# Patient Record
Sex: Male | Born: 1990 | Race: White | Hispanic: Yes | Marital: Single | Smoking: Never smoker
Health system: Southern US, Community
[De-identification: ages and names within clinical notes are randomized; demographics above are authoritative.]

## PROBLEM LIST (undated history)

## (undated) DIAGNOSIS — E119 Type 2 diabetes mellitus without complications: Secondary | ICD-10-CM

## (undated) DIAGNOSIS — T7840XA Allergy, unspecified, initial encounter: Secondary | ICD-10-CM

## (undated) HISTORY — DX: Allergy, unspecified, initial encounter: T78.40XA

## (undated) HISTORY — DX: Type 2 diabetes mellitus without complications: E11.9

---

## 2014-04-13 ENCOUNTER — Ambulatory Visit (INDEPENDENT_AMBULATORY_CARE_PROVIDER_SITE_OTHER): Payer: BC Managed Care – HMO

## 2014-04-13 ENCOUNTER — Ambulatory Visit (INDEPENDENT_AMBULATORY_CARE_PROVIDER_SITE_OTHER): Payer: BC Managed Care – HMO | Admitting: Family Medicine

## 2014-04-13 VITALS — BP 112/74 | HR 83 | Temp 97.8°F | Resp 16 | Ht 70.0 in | Wt 175.0 lb

## 2014-04-13 DIAGNOSIS — M79672 Pain in left foot: Secondary | ICD-10-CM

## 2014-04-13 DIAGNOSIS — M25572 Pain in left ankle and joints of left foot: Secondary | ICD-10-CM

## 2014-04-13 DIAGNOSIS — M7750 Other enthesopathy of unspecified foot: Secondary | ICD-10-CM

## 2014-04-13 DIAGNOSIS — M25579 Pain in unspecified ankle and joints of unspecified foot: Secondary | ICD-10-CM

## 2014-04-13 DIAGNOSIS — M79609 Pain in unspecified limb: Secondary | ICD-10-CM

## 2014-04-13 DIAGNOSIS — E109 Type 1 diabetes mellitus without complications: Secondary | ICD-10-CM

## 2014-04-13 DIAGNOSIS — M7672 Peroneal tendinitis, left leg: Secondary | ICD-10-CM

## 2014-04-13 MED ORDER — DICLOFENAC SODIUM 75 MG PO TBEC: 75.0000 mg | DELAYED_RELEASE_TABLET | Freq: Two times a day (BID) | ORAL | Status: DC

## 2014-04-13 NOTE — Progress Notes (Signed)
   Subjective:    Patient ID: Brett LandauRyan Hark, male    DOB: 1991-06-10, 23 y.o.   MRN: [id number]  HPI  Pt presents with 1 week h/o left lateral ankle pain - started when he was playing basketball it started to feel sore so he stopped playing basketball.  The next 2 days it was fine and then 4 days ago the pain started to return and has gotten worse since then.  He did not have a specific injury that he can think of to his ankle to cause his pain.  He has taken a few doses of Aleve without much help and he is not sure the ice he has used as helped his pain.  He has sprained his ankle in the past.  He has noticed some swelling but it has gotten a little better he thinks.  He is having trouble walking because of the pain.  Sugars are ok controlled and he has no kidney damage.   Review of Systems  Musculoskeletal: Positive for gait problem and joint swelling.       Objective:   Physical Exam  Vitals reviewed. Constitutional: He is oriented to person, place, and time. He appears well-developed and well-nourished.  Musculoskeletal:       Left ankle: He exhibits swelling (lateral ankle over peroneal tendon). He exhibits normal range of motion. No lateral malleolus and no medial malleolus tenderness found.       Left foot: He exhibits tenderness, bony tenderness and swelling.       Feet:  Pain with plantar flexion that is increased with resistance.  Pain with inversion and no pain with eversion.  Pulses intact. Sensation intact.  Pt has an antalgic gait.  Neurological: He is alert and oriented to person, place, and time.  Skin: Skin is warm and dry.  Psychiatric: He has a normal mood and affect. His behavior is normal. Judgment and thought content normal.   [facility] reading (PRIMARY) by  Dr. Katrinka BlazingSmith.  neg      Assessment & Plan:  Foot pain, left - Plan: DG Foot Complete Left  Ankle pain, left - Plan: DG Ankle Complete Left  Peroneal tendonitis of left lower extremity - Plan: diclofenac (VOLTAREN) 75  MG EC tablet  Pt has an ASO brace at home that he wants to wear, he does not want a camwalker due to his job at [employer].  He did not want crutches.  He will use the Rx NSAIDs and we talked about ice massage and rest.  If he has no improvement in 7-10 days he will RTC for a recheck.  Benny LennertSarah Weber PA-C  [facility] 04/13/2014 7:30 PM

## 2014-04-13 NOTE — Progress Notes (Signed)
History and physical examinations reviewed in detail with Benny LennertSarah Weber, PA-C.  Xrays reviewed.  Agree with A/P.

## 2014-04-13 NOTE — Patient Instructions (Signed)
Crutches until the pain is improved. Medications for inflammation. Recheck in 7-10d if no better.  Peroneal tendonitis

## 2015-04-20 ENCOUNTER — Ambulatory Visit (INDEPENDENT_AMBULATORY_CARE_PROVIDER_SITE_OTHER): Payer: Commercial Managed Care - PPO | Admitting: Internal Medicine

## 2015-04-20 VITALS — BP 108/74 | HR 88 | Temp 98.2°F | Resp 18 | Ht 69.0 in | Wt 187.0 lb

## 2015-04-20 DIAGNOSIS — H6123 Impacted cerumen, bilateral: Secondary | ICD-10-CM

## 2015-04-20 NOTE — Progress Notes (Signed)
   Subjective:  This chart was scribed for Brett Sia, MD by Brett Lee, Medical Scribe. This patient was seen in Room 1 and the patient's care was started at 5:45 PM.     Patient ID: Brett Lee, male    DOB: [birth year], 24 y.o.   MRN: [id number]  HPI Brett Lee is a 24 y.o. male who presents to [facility] complaining of some gradual onset ear fullness after swimming in [place] and [place] last week. He's never had problems with this before. He has not done anything to relief this.  His hearing seems to be muffled.  Patient Active Problem List   Diagnosis Date Noted  . Diabetes mellitus type 1 04/13/2014    Current outpatient prescriptions:  .  insulin aspart (NOVOLOG) 100 unit/mL injection, Inject 10 Units into the skin 3 (three) times daily before meals., Disp: , Rfl:      Review of Systems  Constitutional: Negative for fever, chills, diaphoresis and fatigue.  HENT: Positive for hearing loss. Negative for rhinorrhea and sneezing.   Respiratory: Negative for cough and shortness of breath.   Cardiovascular: Negative for chest pain.  Gastrointestinal: Negative for nausea, vomiting, diarrhea and constipation.  Skin: Negative for rash and wound.  Neurological: Negative for headaches.       Objective:   Physical Exam  Constitutional: He is oriented to person, place, and time. He appears well-developed and well-nourished. No distress.  HENT:  Head: Normocephalic and atraumatic.  Both ear canals are occluded by cerumen  Eyes: Conjunctivae and EOM are normal. Pupils are equal, round, and reactive to light.  Neck: Neck supple.  Cardiovascular: Normal rate.   Pulmonary/Chest: Effort normal. No respiratory distress.  Musculoskeletal: Normal range of motion.  Lymphadenopathy:    He has no cervical adenopathy.  Neurological: He is alert and oriented to person, place, and time.  Skin: Skin is warm and dry.  Psychiatric: He has a normal mood and affect. His behavior is normal.    Nursing note and vitals reviewed. BP 108/74 mmHg  Pulse 88  Temp(Src) 98.2 F (36.8 C)  Resp 18  Ht 5\' 9"  (1.753 m)  Wt 187 lb (84.823 kg)  BMI 27.60 kg/m2  SpO2 98%  Procedure= irrigation bilaterally with successful canals are clear post irrigation without signs of external otitis. Tms are intact.       Assessment & Plan:  Cerumen impaction  Mineral oil if any itching otherwise follow-up when necessary I have completed the patient encounter in its entirety as documented by the scribe, with editing by me where necessary. Tamaya Pun P. Merla Riches, M.D.

## 2015-06-23 ENCOUNTER — Ambulatory Visit (INDEPENDENT_AMBULATORY_CARE_PROVIDER_SITE_OTHER): Payer: Commercial Managed Care - PPO | Admitting: Family Medicine

## 2015-06-23 VITALS — BP 130/80 | HR 89 | Temp 98.3°F | Resp 16 | Ht 69.5 in | Wt 179.0 lb

## 2015-06-23 DIAGNOSIS — J039 Acute tonsillitis, unspecified: Secondary | ICD-10-CM

## 2015-06-23 MED ORDER — CEFTRIAXONE SODIUM 1 G IJ SOLR
1.0000 g | Freq: Once | INTRAMUSCULAR | Status: AC
  Administered 2015-06-23: 1 g via INTRAMUSCULAR

## 2015-06-23 MED ORDER — AMOXICILLIN-POT CLAVULANATE 875-125 MG PO TABS: 1.0000 | ORAL_TABLET | Freq: Two times a day (BID) | ORAL | Status: DC

## 2015-06-23 MED ORDER — MAGIC MOUTHWASH W/LIDOCAINE: 10.0000 mL | ORAL | Status: DC | PRN

## 2015-06-23 NOTE — Progress Notes (Signed)
Subjective:  This chart was scribed for Elvina Sidle MD, by Veverly Fells, at [facility]  This patient was seen in room  13  and the patient's care was started at 10:13 AM.    Patient ID: Brett Lee, male    DOB: [birth year], 24 y.o.   MRN: [id number] Chief Complaint  Patient presents with  . Sore Throat    today  . Dizziness    HPI HPI Comments: Brett Lee is a 24 y.o. male who [facility] complaining of a sore throat and dizziness/lighteadedness onset this morning.  Patient denies having sore throats frequently. He has a history of diabetes and states that his colds usually last for a long time.  He is requesting a note for school and does not think that he is able to go today.  He is physically fit and played football in highschool.    He is currently attending [employer] and is a communications major.  He has no other complaints or concerns today.     Patient Active Problem List   Diagnosis Date Noted  . Diabetes mellitus type 1 04/13/2014   Past Medical History  Diagnosis Date  . Allergy   . Diabetes mellitus without complication    History reviewed. No pertinent past surgical history. No Known Allergies Prior to Admission medications   Medication Sig Start Date End Date Taking? Authorizing Provider  insulin aspart (NOVOLOG) 100 unit/mL injection Inject 10 Units into the skin 3 (three) times daily before meals.   Yes Historical Provider, MD   Social History   Social History  . Marital Status: Single    Spouse Name: N/A  . Number of Children: N/A  . Years of Education: N/A   Occupational History  . Not on file.   Social History Main Topics  . Smoking status: Current Some Day Smoker  . Smokeless tobacco: Not on file  . Alcohol Use: Not on file  . Drug Use: Not on file  . Sexual Activity: Not on file   Other Topics Concern  . Not on file   Social History Narrative       Review of Systems    HENT: Positive for sore throat.   Neurological: Positive for dizziness.       Objective:   Physical Exam  Constitutional: He is oriented to person, place, and time. He appears well-developed and well-nourished. No distress.  HENT:  Head: Normocephalic and atraumatic.  Eyes: Conjunctivae and EOM are normal. Pupils are equal, round, and reactive to light.  Cardiovascular: Normal rate.   Pulmonary/Chest: Effort normal. No respiratory distress.  Musculoskeletal: Normal range of motion.  Neurological: He is alert and oriented to person, place, and time.  Skin: Skin is warm and dry.  Psychiatric: He has a normal mood and affect. His behavior is normal.  Nursing note and vitals reviewed.  CONSTITUTIONAL: Well developed/well nourished HEAD: Normocephalic/atraumatic EYES: EOMI/PERRL ENMT: Mucous membranes moist, marked erythema and swelling left tonsillar area with early white exudate NECK: supple no meningeal signs SPINE/BACK:entire spine nontender CV: S1/S2 noted, no murmurs/rubs/gallops noted LUNGS: Lungs are clear to auscultation bilaterally, no apparent distress ABDOMEN: soft, nontender, no rebound or guarding, bowel sounds noted throughout abdomen GU:no cva tenderness NEURO: Pt is awake/alert/appropriate, moves all extremitiesx4.  No facial droop.   EXTREMITIES: pulses normal/equal, full ROM SKIN: warm, color normal PSYCH: no abnormalities of mood noted, alert and oriented to situation     Assessment &  Plan:   This chart was scribed in my presence and reviewed by me personally.    ICD-9-CM ICD-10-CM   1. Acute tonsillitis 463 J03.90 cefTRIAXone (ROCEPHIN) injection 1 g     amoxicillin-clavulanate (AUGMENTIN) 875-125 MG per tablet     magic mouthwash w/lidocaine SOLN     Signed, Elvina Sidle, MD

## 2015-06-27 ENCOUNTER — Ambulatory Visit (INDEPENDENT_AMBULATORY_CARE_PROVIDER_SITE_OTHER): Payer: Commercial Managed Care - PPO | Admitting: Emergency Medicine

## 2015-06-27 VITALS — BP 118/68 | HR 74 | Temp 98.2°F | Resp 16 | Ht 69.0 in | Wt 181.6 lb

## 2015-06-27 DIAGNOSIS — Z202 Contact with and (suspected) exposure to infections with a predominantly sexual mode of transmission: Secondary | ICD-10-CM

## 2015-06-27 LAB — HIV ANTIBODY (ROUTINE TESTING W REFLEX): HIV: NONREACTIVE

## 2015-06-27 MED ORDER — AZITHROMYCIN 250 MG PO TABS: ORAL_TABLET | ORAL | Status: DC

## 2015-06-27 NOTE — Progress Notes (Signed)
Patient ID: Brett Lee, male   DOB: [birth year], 24 y.o.   MRN: [id number]    This chart was scribed for Collene Gobble, MD by Charline Bills, ED Scribe. The patient was seen in room 1. Patient's care was started at 1:55 PM.  Chief Complaint:  Chief Complaint  Patient presents with  . Exposure to STD    possible std , x 1 day    HPI: Brett Lee is a 24 y.o. male, with a h/o DM, who reports to [facility] today for STD exposure. Pt states that his male partner contacted him yesterday after testing positive for chlamydia. He reports that neither himself nor his partner have any symptoms. He also reports that he did not consistently wear condoms a while ago, but now he always uses condoms. Pt reports 30 partners in the last 2 years. No ho previous STD.   Past Medical History  Diagnosis Date  . Allergy   . Diabetes mellitus without complication    History reviewed. No pertinent past surgical history. Social History   Social History  . Marital Status: Single    Spouse Name: N/A  . Number of Children: N/A  . Years of Education: N/A   Social History Main Topics  . Smoking status: Never Smoker   . Smokeless tobacco: None  . Alcohol Use: 1.2 oz/week    2 Standard drinks or equivalent per week  . Drug Use: None  . Sexual Activity: Not Asked   Other Topics Concern  . None   Social History Narrative   History reviewed. No pertinent family history. No Known Allergies Prior to Admission medications   Medication Sig Start Date End Date Taking? Authorizing Provider  insulin aspart (NOVOLOG) 100 unit/mL injection Inject 10 Units into the skin 3 (three) times daily before meals.   Yes Historical Provider, MD  magic mouthwash w/lidocaine SOLN Take 10 mLs by mouth every 2 (two) hours as needed for mouth pain. 06/23/15  Yes Elvina Sidle, MD     ROS: The patient denies fevers, chills, night sweats, unintentional weight loss, chest pain, palpitations, wheezing, dyspnea on exertion, nausea,  vomiting, abdominal pain, dysuria, hematuria, melena, numbness, weakness, or tingling.  All other systems have been reviewed and were otherwise negative with the exception of those mentioned in the HPI and as above.    PHYSICAL EXAM: Filed Vitals:   06/27/15 1337  BP: 118/68  Pulse: 74  Temp: 98.2 F (36.8 C)  Resp: 16   Body mass index is 26.81 kg/(m^2).   General: Alert, no acute distress HEENT:  Normocephalic, atraumatic, oropharynx patent. Eye: Nonie Hoyer [facility] Cardiovascular:  Regular rate and rhythm, no rubs murmurs or gallops. No Carotid bruits, radial pulse intact. No pedal edema.  Respiratory: Clear to auscultation bilaterally. No wheezes, rales, or rhonchi. No cyanosis, no use of accessory musculature Abdominal: No organomegaly, abdomen is soft and non-tender, positive bowel sounds. No masses. Small molluscum to lower abdomen.  Musculoskeletal: Gait intact. No edema, tenderness GU: Minimal redness around the meatus but no discharge.  Skin: No rashes. Neurologic: Facial musculature symmetric. Psychiatric: Patient acts appropriately throughout our interaction. Lymphatic: No cervical or submandibular lymphadenopathy Genitourinary/Anorectal: No acute findings   LABS: No results found for this or any previous visit.   EKG/XRAY:   Primary read interpreted by Dr. Cleta Alberts at [facility].   ASSESSMENT/PLAN: STD testing was done. He was given a prescription for Zithromax 1 g. Will call when results are back. I personally performed the services described in this  documentation, which was scribed in my presence. The recorded information has been reviewed and is accurate.  Gross sideeffects, risk and benefits, and alternatives of medications d/w patient. Patient is aware that all medications have potential sideeffects and we are unable to predict every sideeffect or drug-drug interaction that may occur.  Lesle Chris MD 06/27/2015 1:55 PM

## 2015-06-27 NOTE — Patient Instructions (Signed)
Chlamydia Chlamydia is an infection. It is spread through sexual contact. Chlamydia can be in different areas of the body. These areas include the urethra, throat, or rectum. It is important to treat chlamydia as soon as possible. It can damage other organs.  CAUSES  Chlamydia is caused by bacteria. It is a sexually transmitted disease. This means that it is passed from an infected partner during intimate contact. This contact could be with the genitals, mouth, or rectal area.  SIGNS AND SYMPTOMS  There may not be any symptoms. This is often the case early in the infection. If there are symptoms, they are usually mild and may only be noticeable in the morning. Symptoms you may notice include:   Burning with urination.  Pain or swelling in the testicles.  Watery mucus-like discharge from the penis.  Long-standing (chronic) pelvic pain after frequent infections.  Pain, swelling, or itching around the anus.  A sore throat.  Itching, burning, or redness in the eyes, or discharge from the eyes. DIAGNOSIS  To diagnose this infection, your health care provider will do a pelvic exam. A sample of urine or a swab from the rectum may be taken for testing.  TREATMENT  Chlamydia is treated with antibiotic medicines.  HOME CARE INSTRUCTIONS  Take your antibiotic medicine as directed by your health care provider. Finish the antibiotic even if you start to feel better. Incomplete treatment will put you at risk for not being able to have children (sterility).   Take medicines only as directed by your health care provider.   Rest.   Inform any sexual partners about your infection. Even if they are symptom free or have a negative culture or evaluation, they should be treated for the condition.   Do not have sex (intercourse) until treatment is completed and your health care provider says it is okay.   Keep all follow-up visits as directed by your health care provider.   Not all test results  are available during your visit. If your test results are not back during the visit, make an appointment with your health care provider to find out the results. Do not assume everything is normal if you have not heard from your health care provider or the medical facility. It is your responsibility to get your test results. SEEK MEDICAL CARE IF:  You develop new joint pain.  You have a fever. SEEK IMMEDIATE MEDICAL CARE IF:   Your pain increases.   You have abnormal discharge.   You have pain during intercourse. MAKE SURE YOU:   Understand these instructions.  Will watch your condition.  Will get help right away if you are not doing well or get worse. Document Released: 10/10/2005 Document Revised: 02/24/2014 Document Reviewed: 04/18/2013 ExitCare Patient Information 2015 [employer]. This information is not intended to replace advice given to you by your health care provider. Make sure you discuss any questions you have with your health care provider.  

## 2015-06-28 LAB — HEPATITIS C ANTIBODY: HCV AB: NEGATIVE

## 2015-06-28 LAB — RPR

## 2015-06-30 LAB — HSV(HERPES SIMPLEX VRS) I + II AB-IGG
HSV 1 Glycoprotein G Ab, IgG: 1.11 IV — ABNORMAL HIGH
HSV 2 Glycoprotein G Ab, IgG: 0.35 IV

## 2015-07-01 LAB — GC/CHLAMYDIA PROBE AMP
CT Probe RNA: POSITIVE — AB
GC PROBE AMP APTIMA: NEGATIVE

## 2015-12-30 ENCOUNTER — Ambulatory Visit (INDEPENDENT_AMBULATORY_CARE_PROVIDER_SITE_OTHER): Payer: Commercial Managed Care - PPO | Admitting: Physician Assistant

## 2015-12-30 VITALS — BP 130/90 | HR 87 | Temp 98.3°F | Resp 16 | Ht 69.25 in | Wt 180.0 lb

## 2015-12-30 DIAGNOSIS — R238 Other skin changes: Secondary | ICD-10-CM

## 2015-12-30 NOTE — Progress Notes (Signed)
[facility] [address], [place] [address] [phone]  Date:  12/30/2015   Name:  Nadeen LandauRyan Ishaq   DOB:  07-07-1991   MRN:  [id number]  PCP:  No PCP Per Patient    Chief Complaint  Patient presents with  . Mass    on penis x 1 week    History of Present Illness:  Nadeen LandauRyan Tabora is a 25 y.o. male patient [facility] chief complaint of bump on penis.     Bump without pain or pruritus.  There have been no changes for 1 week.  There is no drainage.    Sexually active protected "85%" of time.  No abnormal hematuria, dysuria, or frequency.    Patient Active Problem List   Diagnosis Date Noted  . Diabetes mellitus type 1 (HCC) 04/13/2014    Past Medical History  Diagnosis Date  . Allergy   . Diabetes mellitus without complication (HCC)     No past surgical history on file.  Social History  Substance Use Topics  . Smoking status: Never Smoker   . Smokeless tobacco: None  . Alcohol Use: 1.2 oz/week    2 Standard drinks or equivalent per week    No family history on file.  Allergies  Allergen Reactions  . Other Other (See Comments)    " I have seasonal allergies, I think pollen and I get a stuffy nose and start sneezing."  Per patient.    Medication list has been reviewed and updated.  Current Outpatient Prescriptions on File Prior to Visit  Medication Sig Dispense Refill  . insulin aspart (NOVOLOG) 100 unit/mL injection Inject 10 Units into the skin 3 (three) times daily before meals.     No current facility-administered medications on file prior to visit.    ROS ROS otherwise unremarkable unless listed above.   Physical Examination: BP 130/90 mmHg  Pulse 87  Temp(Src) 98.3 F (36.8 C) (Oral)  Resp 16  Ht 5' 9.25" (1.759 m)  Wt 180 lb (81.647 kg)  BMI 26.39 kg/m2  SpO2 98% Ideal Body Weight: Weight in (lb) to have BMI = 25: 170.2  Physical Exam  Constitutional: He is oriented to person, place, and time. He appears  well-developed and well-nourished. No distress.  HENT:  Head: Normocephalic and atraumatic.  Eyes: Conjunctivae and EOM are normal. Pupils are equal, round, and reactive to light.  Cardiovascular: Normal rate.   Pulmonary/Chest: Effort normal. No respiratory distress.  Genitourinary: Penis normal. Circumcised.  Small hypopigmented papule at the inferior shaft.  Non-tender without erythema or friability.  Neurological: He is alert and oriented to person, place, and time.  Skin: Skin is warm and dry. He is not diaphoretic.  Psychiatric: He has a normal mood and affect. His behavior is normal.     Assessment and Plan: Nadeen LandauRyan Dowty is a 25 y.o. male who is here today for cc of bump on penis. This appears benign, and possibly follicular bump (acne).  This could also be a scant normal anatomy feature that he recently stumbled upon (penile papule/hirsutoid papilloma). I have advised him to monitor the bump.  If there are any changes, he will return.  He declines std testing.  Papule of skin  Trena PlattStephanie English, PA-C [facility] 12/30/2015 2:43 PM

## 2015-12-30 NOTE — Patient Instructions (Signed)
Please return if you notice changes to this lesion.  This can be a pimple or clogged pore.  You can try using a warm compress.  But there is nothing necessary to do if there is no change to that bump.

## 2016-05-17 ENCOUNTER — Emergency Department (HOSPITAL_COMMUNITY)
Admission: EM | Admit: 2016-05-17 | Discharge: 2016-05-17 | Disposition: A | Discharge status: Home / Self Care | Payer: Commercial Managed Care - PPO | Attending: Emergency Medicine | Admitting: Emergency Medicine

## 2016-05-17 ENCOUNTER — Encounter (HOSPITAL_COMMUNITY): Payer: Self-pay | Admitting: Adult Health

## 2016-05-17 DIAGNOSIS — A64 Unspecified sexually transmitted disease: Secondary | ICD-10-CM | POA: Diagnosis present

## 2016-05-17 DIAGNOSIS — E109 Type 1 diabetes mellitus without complications: Secondary | ICD-10-CM | POA: Diagnosis not present

## 2016-05-17 DIAGNOSIS — R369 Urethral discharge, unspecified: Secondary | ICD-10-CM | POA: Insufficient documentation

## 2016-05-17 DIAGNOSIS — Z711 Person with feared health complaint in whom no diagnosis is made: Secondary | ICD-10-CM

## 2016-05-17 LAB — URINALYSIS, ROUTINE W REFLEX MICROSCOPIC
Bilirubin Urine: NEGATIVE
Glucose, UA: NEGATIVE mg/dL
Hgb urine dipstick: NEGATIVE
Ketones, ur: NEGATIVE mg/dL
NITRITE: NEGATIVE
Protein, ur: NEGATIVE mg/dL
SPECIFIC GRAVITY, URINE: 1.027 (ref 1.005–1.030)
pH: 7 (ref 5.0–8.0)

## 2016-05-17 LAB — URINE MICROSCOPIC-ADD ON

## 2016-05-17 LAB — HIV ANTIBODY (ROUTINE TESTING W REFLEX): HIV SCREEN 4TH GENERATION: NONREACTIVE

## 2016-05-17 LAB — RPR: RPR: NONREACTIVE

## 2016-05-17 MED ORDER — LIDOCAINE HCL (PF) 1 % IJ SOLN
5.0000 mL | Freq: Once | INTRAMUSCULAR | Status: AC
  Administered 2016-05-17: 5 mL via INTRADERMAL
  Filled 2016-05-17: qty 5

## 2016-05-17 MED ORDER — AZITHROMYCIN 250 MG PO TABS
1000.0000 mg | ORAL_TABLET | Freq: Once | ORAL | Status: AC
  Administered 2016-05-17: 1000 mg via ORAL
  Filled 2016-05-17: qty 4

## 2016-05-17 MED ORDER — CEFTRIAXONE SODIUM 250 MG IJ SOLR
250.0000 mg | Freq: Once | INTRAMUSCULAR | Status: AC
  Administered 2016-05-17: 250 mg via INTRAMUSCULAR
  Filled 2016-05-17: qty 250

## 2016-05-17 NOTE — ED Triage Notes (Signed)
Presents with 4 days of penile discharge-states, "it feels like I have chlamydia and I have had it before" endorses pain at tip of penis.

## 2016-05-17 NOTE — Discharge Instructions (Signed)
1. Medications: usual home medications 2. Treatment: rest, drink plenty of fluids, use a condom with every sexual encounter 3. Follow Up: Please followup with your primary doctor in 3-7 days for discussion of your diagnoses and further evaluation after today's visit; if you do not have a primary care doctor use the resource guide provided to find one; Please return to the ER for worsening symptoms, high fevers or persistent vomiting.  You have been tested for HIV, syphilis, chlamydia and gonorrhea.  These results will be available in approximately 3 days.  Please inform all sexual partners if you test positive for any of these diseases.

## 2016-05-17 NOTE — ED Provider Notes (Signed)
[facility]-EMERGENCY DEPT Provider Note   CSN: [id number] Arrival date & time: 05/17/16  1740  First Provider Contact:  First MD Initiated Contact with Patient 05/17/16 0507        History   Chief Complaint Chief Complaint  Patient presents with  . SEXUALLY TRANSMITTED DISEASE    HPI Brett Lee is a 25 y.o. male.  Brett Lee is a 25 y.o. male  with a hx of NIDDM presents to the Emergency Department complaining of gradual, persistent, progressively worsening penile discharge onset 4 days ago. Patient reports he's had a minimum of 7 partners in the last 3 months. All male. He reports a history of Chlamydia in the past. He denies any open lesions to his penis. He denies testicular pain, abdominal pain, painful bowel movements, fever, chills, nausea, vomiting.  No aggravating or alleviating factors. No hematuria or overt dysuria.  No treatments prior to arrival.   The history is provided by the patient and medical records. No language interpreter was used.    Past Medical History:  Diagnosis Date  . Allergy   . Diabetes mellitus without complication [facility])     Patient Active Problem List   Diagnosis Date Noted  . Diabetes mellitus type 1 (HCC) 04/13/2014    History reviewed. No pertinent surgical history.     Home Medications    Prior to Admission medications   Medication Sig Start Date End Date Taking? Authorizing Provider  insulin aspart (NOVOLOG) 100 unit/mL injection Inject 10 Units into the skin 3 (three) times daily before meals.    Historical Provider, MD    Family History History reviewed. No pertinent family history.  Social History Social History  Substance Use Topics  . Smoking status: Never Smoker  . Smokeless tobacco: Never Used  . Alcohol use 1.2 oz/week    2 Standard drinks or equivalent per week     Allergies   Other   Review of Systems Review of Systems  Constitutional: Negative for chills and fever.  Gastrointestinal: Negative for  abdominal pain, nausea and vomiting.  Genitourinary: Positive for discharge. Negative for dysuria, penile swelling and scrotal swelling.  All other systems reviewed and are negative.    Physical Exam Updated Vital Signs BP 140/96   Pulse 92   Temp 98.5 F (36.9 C) (Oral)   Resp 20   Ht 5' 9.75" (1.772 m)   Wt 79.4 kg   SpO2 99%   BMI 25.29 kg/m   Physical Exam  Constitutional: He appears well-developed and well-nourished. No distress.  Awake, alert, nontoxic appearance  HENT:  Head: Normocephalic and atraumatic.  Mouth/Throat: Oropharynx is clear and moist. No oropharyngeal exudate.  Eyes: Conjunctivae are normal. No scleral icterus.  Neck: Normal range of motion. Neck supple.  Cardiovascular: Normal rate, regular rhythm and intact distal pulses.   Pulmonary/Chest: Effort normal and breath sounds normal. No respiratory distress. He has no wheezes.  Equal chest expansion  Abdominal: Soft. Bowel sounds are normal. He exhibits no mass. There is no tenderness. There is no rebound and no guarding. Hernia confirmed negative in the right inguinal area and confirmed negative in the left inguinal area.  Genitourinary: Testes normal. Right testis shows no mass, no swelling and no tenderness. Right testis is descended. Cremasteric reflex is not absent on the right side. Left testis shows no mass, no swelling and no tenderness. Left testis is descended. Cremasteric reflex is not absent on the left side. Circumcised. Penile erythema (mild erythema noted to the urethral opening)  present. No phimosis, paraphimosis, hypospadias or penile tenderness. Discharge (milky white) found.  Musculoskeletal: Normal range of motion. He exhibits no edema.  Lymphadenopathy: No inguinal adenopathy noted on the right or left side.  Neurological: He is alert.  Speech is clear and goal oriented Moves extremities without ataxia  Skin: Skin is warm and dry. He is not diaphoretic.  Psychiatric: He has a normal mood  and affect.  Nursing note and vitals reviewed.    ED Treatments / Results  Labs (all labs ordered are listed, but only abnormal results are displayed) Labs Reviewed  URINALYSIS, ROUTINE W REFLEX MICROSCOPIC (NOT AT [facility]) - Abnormal; Notable for the following:       Result Value   Leukocytes, UA MODERATE (*)    All other components within normal limits  URINE MICROSCOPIC-ADD ON - Abnormal; Notable for the following:    Squamous Epithelial / LPF 0-5 (*)    Bacteria, UA FEW (*)    All other components within normal limits  RPR  HIV ANTIBODY (ROUTINE TESTING)  GC/CHLAMYDIA PROBE AMP ([place]) NOT AT [facility]    EKG  EKG Interpretation None       Radiology No results found.  Procedures Procedures (including critical care time)  Medications Ordered in ED Medications  cefTRIAXone (ROCEPHIN) injection 250 mg (not administered)  lidocaine (PF) (XYLOCAINE) 1 % injection 5 mL (not administered)  azithromycin (ZITHROMAX) tablet 1,000 mg (not administered)     Initial Impression / Assessment and Plan / ED Course  I have reviewed the triage vital signs and the nursing notes.  Pertinent labs & imaging results that were available during my care of the patient were reviewed by me and considered in my medical decision making (see chart for details).  Clinical Course   Brett Lee presents with penile discharge.  Patient is afebrile without abdominal tenderness, abdominal pain or painful bowel movements to indicate prostatitis.  No tenderness to palpation of the testes or epididymis to suggest orchitis or epididymitis.  STD cultures obtained including HIV, syphilis, gonorrhea and chlamydia. Patient to be discharged with instructions to follow up with PCP. Discussed importance of using protection when sexually active. Pt understands that they have GC/Chlamydia cultures pending and that they will need to inform all sexual partners if results return positive. Patient has been treated  prophylactically with azithromycin and Rocephin.   Final Clinical Impressions(s) / ED Diagnoses   Final diagnoses:  Concern about STD in male without diagnosis  Penile discharge    New Prescriptions New Prescriptions   No medications on file     Dierdre Forth, PA-C 05/17/16 0526    Dione Booze, MD 05/17/16 [phone]

## 2016-05-17 NOTE — ED Notes (Signed)
Pt upset about wait time and asking if charges will still apply "if I leave, I haven't seen a doctor yet, so it shouldn't matter." Informed pt that provider has signed up to see him.

## 2016-11-17 ENCOUNTER — Ambulatory Visit (INDEPENDENT_AMBULATORY_CARE_PROVIDER_SITE_OTHER): Payer: Managed Care, Other (non HMO) | Admitting: Family Medicine

## 2016-11-17 VITALS — BP 130/80 | HR 86 | Temp 97.4°F | Resp 16 | Ht 69.0 in | Wt 186.0 lb

## 2016-11-17 DIAGNOSIS — Z23 Encounter for immunization: Secondary | ICD-10-CM

## 2016-11-17 DIAGNOSIS — Z202 Contact with and (suspected) exposure to infections with a predominantly sexual mode of transmission: Secondary | ICD-10-CM

## 2016-11-17 NOTE — Patient Instructions (Addendum)
  In the event of flulike symptoms at any time you should still seek medical help, because if treated within the first 48 hours of onset of symptoms antiviral medication can reduce the illness. The shot is not perfect, but this does reduce risk of getting the flu and if he should get the flu usually is a much milder illness. However in a diabetic you should be cautious.  Always advise practicing safe sex if you choose to be sexually involved.   IF you received an x-ray today, you will receive an invoice from [facility]. Please contact [facility] at [phone] with questions or concerns regarding your invoice.   IF you received labwork today, you will receive an invoice from [facility]. Please contact [facility] at [phone] with questions or concerns regarding your invoice.   Our billing staff will not be able to assist you with questions regarding bills from these companies.  You will be contacted with the lab results as soon as they are available. The fastest way to get your results is to activate your My Chart account. Instructions are located on the last page of this paperwork. If you have not heard from us regarding the results in 2 weeks, please contact this office.

## 2016-11-17 NOTE — Progress Notes (Signed)
Patient ID: Brett LandauRyan Lee, male    DOB: 09-04-1991  Age: 26 y.o. MRN: [id number]  Chief Complaint  Patient presents with  . STD check  . 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 He has been treated last summer for chlamydia. Since then he had a sexual encounter where he did not use protection, and could've been exposed to an STD. He would like to be retested.  He is due an influenza vaccine. He is a type I diabetic and is [place] in [place].  No other current or recent illnesses  Current allergies, medications, problem list, past/family and social histories reviewed.  Objective:  BP 130/80   Pulse 86   Temp 97.4 F (36.3 C) (Oral)   Resp 16   Ht 5\' 9"  (1.753 m)   Wt 186 lb (84.4 kg)   SpO2 98%   BMI 27.47 kg/m   Healthy-appearing young man. He is asymptomatic. Physical exam was not performed today.  Assessment & Plan:   Assessment: 1. Possible exposure to STD   2. Needs flu shot       Plan: Check STD testing including HIV, RPR, and a URiprobe Flu vaccine  Orders Placed This Encounter  Procedures  . GC/Chlamydia Probe Amp  . Flu Vaccine QUAD 36+ mos IM  . RPR  . HIV antibody    No orders of the defined types were placed in this encounter.        Patient Instructions    In the event of flulike symptoms at any time you should still seek medical help, because if treated within the first 48 hours of onset of symptoms antiviral medication can reduce the illness. The shot is not perfect, but this does reduce risk of getting the flu and if he should get the flu usually is a much milder illness. However in a diabetic you should be cautious.  Always advise practicing safe sex if you choose to be sexually involved.   IF you received an x-ray today, you will receive an invoice from [facility]. Please contact [facility] at [phone] with questions or concerns regarding your invoice.    IF you received labwork today, you will receive an invoice from [facility]. Please contact [facility] at [phone] with questions or concerns regarding your invoice.   Our billing staff will not be able to assist you with questions regarding bills from these companies.  You will be contacted with the lab results as soon as they are available. The fastest way to get your results is to activate your My Chart account. Instructions are located on the last page of this paperwork. If you have not heard from us regarding the results in 2 weeks, please contact this office.        No Follow-up on file.   HOPPER,DAVID, MD 11/17/2016

## 2016-11-18 LAB — HIV ANTIBODY (ROUTINE TESTING W REFLEX): HIV Screen 4th Generation wRfx: NONREACTIVE

## 2016-11-18 LAB — RPR: RPR: NONREACTIVE

## 2016-11-23 LAB — GC/CHLAMYDIA PROBE AMP
Chlamydia trachomatis, NAA: NEGATIVE
NEISSERIA GONORRHOEAE BY PCR: NEGATIVE

## 2017-01-27 ENCOUNTER — Ambulatory Visit (HOSPITAL_COMMUNITY)
Admission: EM | Admit: 2017-01-27 | Discharge: 2017-01-27 | Disposition: A | Discharge status: Home / Self Care | Payer: 59 | Attending: Internal Medicine | Admitting: Internal Medicine

## 2017-01-27 ENCOUNTER — Encounter (HOSPITAL_COMMUNITY): Payer: Self-pay | Admitting: *Deleted

## 2017-01-27 DIAGNOSIS — J301 Allergic rhinitis due to pollen: Secondary | ICD-10-CM

## 2017-01-27 DIAGNOSIS — B001 Herpesviral vesicular dermatitis: Secondary | ICD-10-CM | POA: Diagnosis not present

## 2017-01-27 MED ORDER — VALACYCLOVIR HCL 1 G PO TABS: ORAL_TABLET | ORAL | 0 refills | Status: AC

## 2017-01-27 NOTE — ED Triage Notes (Signed)
Mouth  Sores       X  2  Days  Ago     muitiple  Lesions          aleergie  Symptoms  As   Well  To  Include  Cough  Congested   And  Sinus   Drainage

## 2017-01-27 NOTE — Discharge Instructions (Signed)
Take the Valtrex as directed. Medications to help with symptoms of allergies are listed below. Sudafed PE 10 mg every 4 to 6 hours as needed for congestion Allegra or Zyrtec daily as needed for drainage and runny nose. For stronger antihistamine may take Chlor-Trimeton 2 to 4 mg every 4 to 6 hours, may cause drowsiness. Saline nasal spray used frequently. Ibuprofen 600 mg every 6 hours as needed for pain, discomfort or fever. Drink plenty of fluids and stay well-hydrated. Flonase or Rhinocort nasal spray daily

## 2017-01-27 NOTE — ED Provider Notes (Signed)
CSN: [id number]     Arrival date & time 01/27/17  1446 History   First MD Initiated Contact with Patient 01/27/17 1524     Chief Complaint  Patient presents with  . Mouth Lesions   (Consider location/radiation/quality/duration/timing/severity/associated sxs/prior Treatment) 26 year old male complaining of sores around his lips for 3-4 days. 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 He has never had an outbreak.      Past Medical History:  Diagnosis Date  . Allergy   . Diabetes mellitus without complication (HCC)    History reviewed. No pertinent surgical history. History reviewed. No pertinent family history. Social History  Substance Use Topics  . Smoking status: Never Smoker  . Smokeless tobacco: Never Used  . Alcohol use 1.2 oz/week    2 Standard drinks or equivalent per week    Review of Systems  Constitutional: Negative.        Malaise  HENT: Positive for congestion, mouth sores, postnasal drip and rhinorrhea.   Respiratory: Negative.   Gastrointestinal: Negative.   Neurological: Negative.   Psychiatric/Behavioral: Negative.   All other systems reviewed and are negative.   Allergies  Other  Home Medications   Prior to Admission medications   Medication Sig Start Date End Date Taking? Authorizing Provider  insulin aspart (NOVOLOG) 100 unit/mL injection Inject 10 Units into the skin 3 (three) times daily before meals.    Historical Provider, MD  valACYclovir (VALTREX) 1000 MG tablet Take 2 tabs every 12 hours for 1 day 01/27/17   Hayden Rasmussen, NP   Meds Ordered and Administered this Visit  Medications - No data to display  BP 130/70 (BP Location: Right Arm)   Pulse 80   Temp 98.6 F (37 C) (Oral)   Resp 18   SpO2 100%  No data found.   Physical Exam  Constitutional: He is oriented to person, place, and time. He appears well-developed and well-nourished. No distress.  HENT:  Mouth/Throat: Oropharynx is clear and moist.  The upper lip  left of the midline has 2 tender vesicular lesions over a red base that is best seen under magnification and light. There is a third lesion to the right lower lip with a slightly different appearance but still red and tender.  Positive for PND.  Eyes: EOM are normal.  Neck: Normal range of motion. Neck supple.  Cardiovascular: Normal rate, regular rhythm, normal heart sounds and intact distal pulses.   Pulmonary/Chest: Effort normal and breath sounds normal. No respiratory distress.  Musculoskeletal: Normal range of motion. He exhibits no edema.  Lymphadenopathy:    He has no cervical adenopathy.  Neurological: He is alert and oriented to person, place, and time.  Skin: Skin is warm and dry. No rash noted.  Psychiatric: He has a normal mood and affect.  Nursing note and vitals reviewed.   Urgent Care Course     Procedures (including critical care time)  Labs Review Labs Reviewed - No data to display  Imaging Review No results found.   Visual Acuity Review  Right Eye Distance:   Left Eye Distance:   Bilateral Distance:    Right Eye Near:   Left Eye Near:    Bilateral Near:         MDM   1. Herpes labialis without complication   2. Acute seasonal allergic rhinitis due to pollen    Take the Valtrex as directed. Medications to help with symptoms of allergies are listed below. Sudafed PE 10 mg  every 4 to 6 hours as needed for congestion Allegra or Zyrtec daily as needed for drainage and runny nose. For stronger antihistamine may take Chlor-Trimeton 2 to 4 mg every 4 to 6 hours, may cause drowsiness. Saline nasal spray used frequently. Ibuprofen 600 mg every 6 hours as needed for pain, discomfort or fever. Drink plenty of fluids and stay well-hydrated. Flonase or Rhinocort nasal spray daily Meds ordered this encounter  Medications  . valACYclovir (VALTREX) 1000 MG tablet    Sig: Take 2 tabs every 12 hours for 1 day    Dispense:  4 tablet    Refill:  0    Order  Specific Question:   Supervising Provider    Answer:   Eustace Moore [[id number]]       Hayden Rasmussen, NP 01/27/17 [phone]

## 2017-01-29 ENCOUNTER — Encounter (HOSPITAL_COMMUNITY): Payer: Self-pay | Admitting: Emergency Medicine

## 2017-01-29 ENCOUNTER — Ambulatory Visit (HOSPITAL_COMMUNITY)
Admission: EM | Admit: 2017-01-29 | Discharge: 2017-01-29 | Disposition: A | Discharge status: Home / Self Care | Payer: 59 | Attending: Family Medicine | Admitting: Family Medicine

## 2017-01-29 DIAGNOSIS — L739 Follicular disorder, unspecified: Secondary | ICD-10-CM

## 2017-01-29 NOTE — Discharge Instructions (Signed)
Apply a warm compress 15 minutes at a time 2-3 times a day, should your infection worsen or fail to resolve return to clinic or follow up with a primary care provider

## 2017-01-29 NOTE — ED Triage Notes (Signed)
Reports cold sore.  Patient has a bump in groin area that was noticed Wednesday morning.

## 2017-01-29 NOTE — ED Provider Notes (Signed)
CSN: [id number]     Arrival date & time 01/29/17  1918 History   First MD Initiated Contact with Patient 01/29/17 2008     Chief Complaint  Patient presents with  . Exposure to STD   (Consider location/radiation/quality/duration/timing/severity/associated sxs/prior Treatment) 26 year old male presents to clinic for evaluation of a small bump in his groin. He states that he recently "man escaped" and that he noticed a red bump that is painless. States that 2 months ago he was tested for multiple STDs, but he was negative for everything including HSV-2, and that he is been abstinent since testing. He has no systemic symptoms such as fever, malaise, nausea, or other symptoms.   The history is provided by the patient.    Past Medical History:  Diagnosis Date  . Allergy   . Diabetes mellitus without complication (HCC)    History reviewed. No pertinent surgical history. History reviewed. No pertinent family history. Social History  Substance Use Topics  . Smoking status: Never Smoker  . Smokeless tobacco: Never Used  . Alcohol use 1.2 oz/week    2 Standard drinks or equivalent per week    Review of Systems  Constitutional: Negative.   HENT: Negative.   Respiratory: Negative.   Cardiovascular: Negative.   Gastrointestinal: Negative.   Genitourinary: Negative.   Musculoskeletal: Negative.   Skin: Positive for wound.  Neurological: Negative.     Allergies  Other  Home Medications   Prior to Admission medications   Medication Sig Start Date End Date Taking? Authorizing Provider  insulin aspart (NOVOLOG) 100 unit/mL injection Inject 10 Units into the skin 3 (three) times daily before meals.    Historical Provider, MD  valACYclovir (VALTREX) 1000 MG tablet Take 2 tabs every 12 hours for 1 day 01/27/17   Hayden Rasmussen, NP   Meds Ordered and Administered this Visit  Medications - No data to display  BP (!) 151/95 (BP Location: Right Arm)   Pulse 78   Temp 98.5 F (36.9 C) (Oral)    Resp 20   SpO2 97%  No data found.   Physical Exam  Constitutional: He is oriented to person, place, and time. He appears well-developed and well-nourished. No distress.  HENT:  Head: Normocephalic and atraumatic.  Right Ear: External ear normal.  Left Ear: External ear normal.  Genitourinary: Testes normal and penis normal. Right testis shows no tenderness. Left testis shows no tenderness. Circumcised. No penile erythema. No discharge found.     Lymphadenopathy: No inguinal adenopathy noted on the right or left side.  Neurological: He is alert and oriented to person, place, and time.  Skin: Skin is warm and dry. Capillary refill takes less than 2 seconds. He is not diaphoretic.  Psychiatric: He has a normal mood and affect. His behavior is normal.  Nursing note and vitals reviewed.   Urgent Care Course     Procedures (including critical care time)  Labs Review Labs Reviewed - No data to display  Imaging Review No results found.      MDM   1. Folliculitis    Lesion appears to be a small area of infection consistent with folliculitis from shaving. Is not any pain or discomfort. Recommended warm compresses 15 minutes at a time 2-3 times a day, follow-up with primary care, return to clinic if it becomes painful, or go to the emergency room at any time he sees any red streaking.     Dorena Bodo, NP 01/29/17 2134

## 2018-06-28 ENCOUNTER — Other Ambulatory Visit: Payer: Self-pay

## 2018-06-28 ENCOUNTER — Emergency Department (HOSPITAL_COMMUNITY): Payer: Commercial Managed Care - PPO

## 2018-06-28 ENCOUNTER — Observation Stay (HOSPITAL_COMMUNITY)
Admission: EM | Admit: 2018-06-28 | Discharge: 2018-06-30 | Disposition: A | Discharge status: Home / Self Care | Payer: Commercial Managed Care - PPO | Attending: Surgery | Admitting: Surgery

## 2018-06-28 DIAGNOSIS — Z794 Long term (current) use of insulin: Secondary | ICD-10-CM | POA: Insufficient documentation

## 2018-06-28 DIAGNOSIS — Z9641 Presence of insulin pump (external) (internal): Secondary | ICD-10-CM | POA: Diagnosis not present

## 2018-06-28 DIAGNOSIS — K3589 Other acute appendicitis without perforation or gangrene: Secondary | ICD-10-CM | POA: Diagnosis not present

## 2018-06-28 DIAGNOSIS — K381 Appendicular concretions: Secondary | ICD-10-CM | POA: Insufficient documentation

## 2018-06-28 DIAGNOSIS — E109 Type 1 diabetes mellitus without complications: Secondary | ICD-10-CM | POA: Insufficient documentation

## 2018-06-28 DIAGNOSIS — K37 Unspecified appendicitis: Secondary | ICD-10-CM | POA: Diagnosis present

## 2018-06-28 DIAGNOSIS — R748 Abnormal levels of other serum enzymes: Secondary | ICD-10-CM

## 2018-06-28 LAB — URINALYSIS, ROUTINE W REFLEX MICROSCOPIC
BILIRUBIN URINE: NEGATIVE
Bacteria, UA: NONE SEEN
GLUCOSE, UA: NEGATIVE mg/dL
Ketones, ur: 80 mg/dL — AB
LEUKOCYTES UA: NEGATIVE
Nitrite: NEGATIVE
PH: 5 (ref 5.0–8.0)
Protein, ur: 100 mg/dL — AB
SPECIFIC GRAVITY, URINE: 1.033 — AB (ref 1.005–1.030)

## 2018-06-28 LAB — COMPREHENSIVE METABOLIC PANEL
ALT: 99 U/L — AB (ref 0–44)
AST: 160 U/L — AB (ref 15–41)
Albumin: 4.6 g/dL (ref 3.5–5.0)
Alkaline Phosphatase: 60 U/L (ref 38–126)
Anion gap: 12 (ref 5–15)
BUN: 16 mg/dL (ref 6–20)
CHLORIDE: 100 mmol/L (ref 98–111)
CO2: 27 mmol/L (ref 22–32)
Calcium: 9.4 mg/dL (ref 8.9–10.3)
Creatinine, Ser: 0.84 mg/dL (ref 0.61–1.24)
Glucose, Bld: 132 mg/dL — ABNORMAL HIGH (ref 70–99)
Potassium: 4.1 mmol/L (ref 3.5–5.1)
SODIUM: 139 mmol/L (ref 135–145)
Total Bilirubin: 0.9 mg/dL (ref 0.3–1.2)
Total Protein: 7.8 g/dL (ref 6.5–8.1)

## 2018-06-28 LAB — LIPASE, BLOOD: LIPASE: 23 U/L (ref 11–51)

## 2018-06-28 LAB — CBC
HCT: 44.1 % (ref 39.0–52.0)
Hemoglobin: 15.5 g/dL (ref 13.0–17.0)
MCH: 30 pg (ref 26.0–34.0)
MCHC: 35.1 g/dL (ref 30.0–36.0)
MCV: 85.5 fL (ref 78.0–100.0)
PLATELETS: 286 10*3/uL (ref 150–400)
RBC: 5.16 MIL/uL (ref 4.22–5.81)
RDW: 12.6 % (ref 11.5–15.5)
WBC: 15.3 10*3/uL — AB (ref 4.0–10.5)

## 2018-06-28 LAB — CBG MONITORING, ED
GLUCOSE-CAPILLARY: 164 mg/dL — AB (ref 70–99)
Glucose-Capillary: 165 mg/dL — ABNORMAL HIGH (ref 70–99)

## 2018-06-28 MED ORDER — PIPERACILLIN-TAZOBACTAM 3.375 G IVPB 30 MIN
3.3750 g | Freq: Once | INTRAVENOUS | Status: DC
  Administered 2018-06-28: 3.375 g via INTRAVENOUS
  Filled 2018-06-28: qty 50

## 2018-06-28 MED ORDER — IOPAMIDOL (ISOVUE-300) INJECTION 61%
100.0000 mL | Freq: Once | INTRAVENOUS | Status: AC | PRN
  Administered 2018-06-28: 100 mL via INTRAVENOUS

## 2018-06-28 MED ORDER — SODIUM CHLORIDE 0.9 % IV SOLN
2.0000 g | Freq: Once | INTRAVENOUS | Status: AC
  Administered 2018-06-28: 2 g via INTRAVENOUS
  Filled 2018-06-28: qty 20

## 2018-06-28 MED ORDER — SODIUM CHLORIDE 0.9 % IV BOLUS
1000.0000 mL | Freq: Once | INTRAVENOUS | Status: AC
  Administered 2018-06-28: 1000 mL via INTRAVENOUS

## 2018-06-28 MED ORDER — ONDANSETRON 4 MG PO TBDP: 4.0000 mg | ORAL_TABLET | Freq: Four times a day (QID) | ORAL | Status: DC | PRN

## 2018-06-28 MED ORDER — MORPHINE SULFATE (PF) 4 MG/ML IV SOLN
4.0000 mg | Freq: Once | INTRAVENOUS | Status: DC
  Filled 2018-06-28: qty 1

## 2018-06-28 MED ORDER — METRONIDAZOLE IN NACL 5-0.79 MG/ML-% IV SOLN
500.0000 mg | Freq: Once | INTRAVENOUS | Status: AC
  Administered 2018-06-28: 500 mg via INTRAVENOUS
  Filled 2018-06-28: qty 100

## 2018-06-28 MED ORDER — ONDANSETRON HCL 4 MG/2ML IJ SOLN
4.0000 mg | Freq: Four times a day (QID) | INTRAMUSCULAR | Status: DC | PRN
  Administered 2018-06-29: 4 mg via INTRAVENOUS
  Filled 2018-06-28: qty 2

## 2018-06-28 NOTE — ED Provider Notes (Signed)
[facility]-EMERGENCY DEPT Provider Note   CSN: [id number] Arrival date & time: 06/28/18  1800     History   Chief Complaint Chief Complaint  Patient presents with  . Abdominal Pain  . Nausea  . Emesis  . Diarrhea    HPI Brett Lee is a 27 y.o. male.  HPI  Pt is a 27 y/o male with a h/o T1DM (insulin pump) who presents to the ED today c/o periumbilical abd pain that began around 10:30/11AM. Initially pain was 3/10 and now pain 9/10. Pain is constant. He tried taking pepto-bismol sxs worsened. He reports associated NV. States he has had diarrhea for the last 5 days. Denies bloody stool, states stool is green. Denies fevers, chills. Denies urinary sxs.  States he grilled out with his friends about 1 week ago, and diarrhea started after that. States he thinks that everything was cooked properly but he is unsure. Denies sick contacts with similar sxs. Denies recent trips out of the country.   Pt was [facility] at [facility] and was advised to come to the ED to r/o appendicitis. Prior to discharge he was given toradol and zofran which have improved his pain and vomiting.   Past Medical History:  Diagnosis Date  . Allergy   . Diabetes mellitus without complication [facility])     Patient Active Problem List   Diagnosis Date Noted  . Diabetes mellitus type 1 (HCC) 04/13/2014    No past surgical history on file.      Home Medications    Prior to Admission medications   Medication Sig Start Date End Date Taking? Authorizing Provider  glucagon 1 MG injection Inject 1 mg into the skin daily as needed (low blood sugar).  06/27/18  Yes [provider]  HUMALOG 100 UNIT/ML injection USE WITH INSULIN PUMP AS DIRECTED: BASAL RATE 1.65 UNITS/HR WITH 8 UNIT BOLUS FOR CARBS. AVG 90/DAY 04/14/18  Yes [provider]  valACYclovir (VALTREX) 1000 MG tablet Take 2 tabs every 12 hours for 1 day Patient taking differently: Take 1,000 mg by mouth every 12 (twelve) hours  as needed (cold sores). Take 2 tabs every 12 hours for 1 day 01/27/17  Yes Hayden Rasmussen, NP    Family History No family history on file.  Social History Social History   Tobacco Use  . Smoking status: Never Smoker  . Smokeless tobacco: Never Used  Substance Use Topics  . Alcohol use: Yes    Alcohol/week: 2.0 standard drinks    Types: 2 Standard drinks or equivalent per week  . Drug use: No     Allergies   Other   Review of Systems Review of Systems  Constitutional: Negative for chills and fever.  HENT: Negative for ear pain and sore throat.   Eyes: Negative for pain and visual disturbance.  Respiratory: Negative for cough and shortness of breath.   Cardiovascular: Negative for chest pain and palpitations.  Gastrointestinal: Positive for abdominal pain, diarrhea, nausea and vomiting. Negative for blood in stool and constipation.  Genitourinary: Negative for dysuria and hematuria.  Musculoskeletal: Negative for back pain.  Skin: Negative for rash.  Neurological: Negative for headaches.  All other systems reviewed and are negative.   Physical Exam Updated Vital Signs BP (!) 141/77   Pulse 100   Temp 98.9 F (37.2 C) (Oral)   Resp 17   Ht 5\' 10"  (1.778 m)   Wt 92.5 kg   SpO2 98%   BMI 29.27 kg/m  Physical Exam  Constitutional: He appears well-developed and well-nourished.  HENT:  Head: Normocephalic and atraumatic.  Eyes: Conjunctivae are normal.  Neck: Neck supple.  Cardiovascular: Normal rate and regular rhythm.  No murmur heard. Pulmonary/Chest: Effort normal and breath sounds normal. No respiratory distress.  Abdominal: Soft. There is tenderness.  TTP to the RLQ with mild rebound TTP. Negative rovsing's sign. Mild guarding.   Musculoskeletal: He exhibits no edema.  Neurological: He is alert.  Skin: Skin is warm and dry.  Psychiatric: He has a normal mood and affect.  Nursing note and vitals reviewed.  ED Treatments / Results  Labs (all labs ordered  are listed, but only abnormal results are displayed) Labs Reviewed  COMPREHENSIVE METABOLIC PANEL - Abnormal; Notable for the following components:      Result Value   Glucose, Bld 132 (*)    AST 160 (*)    ALT 99 (*)    All other components within normal limits  CBC - Abnormal; Notable for the following components:   WBC 15.3 (*)    All other components within normal limits  URINALYSIS, ROUTINE W REFLEX MICROSCOPIC - Abnormal; Notable for the following components:   Color, Urine AMBER (*)    APPearance HAZY (*)    Specific Gravity, Urine 1.033 (*)    Hgb urine dipstick SMALL (*)    Ketones, ur 80 (*)    Protein, ur 100 (*)    All other components within normal limits  CBG MONITORING, ED - Abnormal; Notable for the following components:   Glucose-Capillary 165 (*)    All other components within normal limits  LIPASE, BLOOD    EKG None  Radiology Ct Abdomen Pelvis W Contrast  Result Date: 06/28/2018 CLINICAL DATA:  27 year old male with acute RIGHT abdominal and pelvic pain with vomiting today. EXAM: CT ABDOMEN AND PELVIS WITH CONTRAST TECHNIQUE: Multidetector CT imaging of the abdomen and pelvis was performed using the standard protocol following bolus administration of intravenous contrast. CONTRAST:  ISOVUE-300 IOPAMIDOL (ISOVUE-300) INJECTION 61% COMPARISON:  None. FINDINGS: Lower chest: No acute abnormality Hepatobiliary: The liver and gallbladder are unremarkable. No biliary dilatation. Pancreas: Unremarkable Spleen: Unremarkable Adrenals/Urinary Tract: The kidneys, adrenal glands and bladder are unremarkable. Stomach/Bowel: An enlarged appendix is noted compatible with appendicitis. A 6 mm appendicolith at the appendiceal origin is noted. No free fluid, abscess, bowel obstruction or pneumoperitoneum. No other bowel abnormalities are identified. Vascular/Lymphatic: No significant vascular findings are present. No enlarged abdominal or pelvic lymph nodes. Reproductive:  Prostate is unremarkable. Other: None Musculoskeletal: No acute or suspicious bony abnormalities. 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 IMPRESSION: Appendicitis without complicating features. 6 mm appendicolith at the appendiceal origin. No free fluid, abscess or pneumoperitoneum. Electronically Signed   By: Harmon Pier M.D.   On: 06/28/2018 21:29    Procedures Procedures (including critical care time)  Medications Ordered in ED Medications  sodium chloride 0.9 % bolus 1,000 mL (1,000 mLs Intravenous New Bag/Given 06/28/18 2202)  morphine 4 MG/ML injection 4 mg (4 mg Intravenous Not Given 06/28/18 2203)  cefTRIAXone (ROCEPHIN) 2 g in sodium chloride 0.9 % 100 mL IVPB (has no administration in time range)    And  metroNIDAZOLE (FLAGYL) IVPB 500 mg (has no administration in time range)  iopamidol (ISOVUE-300) 61 % injection 100 mL (100 mLs Intravenous Contrast Given 06/28/18 2047)     Initial Impression / Assessment and Plan / ED Course  I have reviewed the triage vital signs and the nursing notes.  Pertinent labs & imaging results that were available during my care of the patient were reviewed by me and considered in my medical decision making (see chart for details).    Discussed pt presentation and exam findings with Dr. Adela Lank, who agrees with the plan to consult general surgery.   Final Clinical Impressions(s) / ED Diagnoses   Final diagnoses:  Appendicitis, unspecified appendicitis type  Elevated liver enzymes   27 year old male with history of T1 DM presents emergency department today for evaluation of right lower quadrant abdominal pain that began earlier today and also associate with nausea, vomiting and diarrhea.  Has had borderline tachycardia but otherwise stable vital signs the ED today.  Afebrile.  Has right lower quadrant tenderness on exam with rebound tenderness and mild guarding.  CBC with mild leukocytosis.  No anemia.  CMP with elevated liver enzymes.  AST elevated to 160 and ALT elevated to 99. Creatinine normal. Glucose 132. No elevated anion or and normal bicarb.   CT abdomen pelvis shows an uncomplicated appendicitis with 6 mm fecalith present.  No abscess, perforation or pneumoperitoneum.  Discussed findings of CT scan as well as elevated liver enzymes with patient.  Advised that he will need to have his liver enzymes rechecked, could be due to underlying viral infection given his recent reports of nausea vomiting and diarrhea.  Discussed plan for admission to general surgery service.  Patient in agreement with plan.  10:05 PM CONSULT  With Dr. Dwain Sarna who will admit the patient.  ED Discharge Orders    None       Rayne Du 06/28/18 2219    Melene Plan, DO 06/28/18 2229    Melene Plan, DO 06/28/18 2337

## 2018-06-28 NOTE — ED Triage Notes (Signed)
Patient reports epigastric abdominal pain that started at 11am. +N/V, diarrhea 4-5 days. No hx abdominal surgeries. Patient was seen at urgent care and sent here for r/o appendictis. Denis fevers, urinary complaints.Hx diabetes, on an insulin pump.

## 2018-06-28 NOTE — ED Notes (Signed)
Patient received zofran an toradol at urgent care.

## 2018-06-28 NOTE — ED Notes (Signed)
Pt has had 1 cup of juice and 1 cup of water, now he is nauseated. Emesis bag given.

## 2018-06-28 NOTE — ED Notes (Signed)
ED TO INPATIENT HANDOFF REPORT  Name/Age/Gender Brett Lee 27 y.o. male  Code Status   Home/SNF/Other Home  Chief Complaint abdominal pain   Level of Care/Admitting Diagnosis ED Disposition    ED Disposition Condition [facility] Area: [place] [[id number]]  Level of Care: Med-Surg [16]  Diagnosis: Appendicitis [268341]  Admitting Physician: CCS, [place]  Attending Physician: CCS, MD [[id number]  PT Class (Do Not Modify): Observation [104]  PT Acc Code (Do Not Modify): Observation [10022]       Medical History Past Medical History:  Diagnosis Date  . Allergy   . Diabetes mellitus without complication (HCC)     Allergies Allergies  Allergen Reactions  . Other Other (See Comments)    " I have seasonal allergies, I think pollen and I get a stuffy nose and start sneezing."  Per patient.    IV Location/Drains/Wounds Patient Lines/Drains/Airways Status   Active Line/Drains/Airways    Name:   Placement date:   Placement time:   Site:   Days:   Peripheral IV 06/28/18 Left Hand   06/28/18    2019    Hand   less than 1          Labs/Imaging Results for orders placed or performed during the hospital encounter of 06/28/18 (from the past 48 hour(s))  Lipase, blood     Status: None   Collection Time: 06/28/18  6:44 PM  Result Value Ref Range   Lipase 23 11 - 51 U/L    Comment: Performed at [facility], [place] [address] [place], [place] [address]  Comprehensive metabolic panel     Status: Abnormal   Collection Time: 06/28/18  6:44 PM  Result Value Ref Range   Sodium 139 135 - 145 mmol/L   Potassium 4.1 3.5 - 5.1 mmol/L   Chloride 100 98 - 111 mmol/L   CO2 27 22 - 32 mmol/L   Glucose, Bld 132 (H) 70 - 99 mg/dL   BUN 16 6 - 20 mg/dL   Creatinine, Ser 0.84 0.61 - 1.24 mg/dL   Calcium 9.4 8.9 - 10.3 mg/dL   Total Protein 7.8 6.5 - 8.1 g/dL   Albumin 4.6 3.5 - 5.0 g/dL   AST 160 (H) 15 - 41 U/L   ALT 99 (H) 0 - 44  U/L   Alkaline Phosphatase 60 38 - 126 U/L   Total Bilirubin 0.9 0.3 - 1.2 mg/dL   GFR calc non Af Amer >60 >60 mL/min   GFR calc Af Amer >60 >60 mL/min    Comment: (NOTE) The eGFR has been calculated using the CKD EPI equation. This calculation has not been validated in all clinical situations. eGFR's persistently <60 mL/min signify possible Chronic Kidney Disease.    Anion gap 12 5 - 15    Comment: Performed at [facility], [place] [address], [place], [place] [address]  CBC     Status: Abnormal   Collection Time: 06/28/18  6:44 PM  Result Value Ref Range   WBC 15.3 (H) 4.0 - 10.5 K/uL   RBC 5.16 4.22 - 5.81 MIL/uL   Hemoglobin 15.5 13.0 - 17.0 g/dL   HCT 44.1 39.0 - 52.0 %   MCV 85.5 78.0 - 100.0 fL   MCH 30.0 26.0 - 34.0 pg   MCHC 35.1 30.0 - 36.0 g/dL   RDW 12.6 11.5 - 15.5 %   Platelets 286 150 - 400 K/uL    Comment:  Performed at [facility], [place] [address], [place], [place] [address]  Urinalysis, Routine w reflex microscopic     Status: Abnormal   Collection Time: 06/28/18  8:14 PM  Result Value Ref Range   Color, Urine AMBER (A) YELLOW    Comment: BIOCHEMICALS MAY BE AFFECTED BY COLOR   APPearance HAZY (A) CLEAR   Specific Gravity, Urine 1.033 (H) 1.005 - 1.030   pH 5.0 5.0 - 8.0   Glucose, UA NEGATIVE NEGATIVE mg/dL   Hgb urine dipstick SMALL (A) NEGATIVE   Bilirubin Urine NEGATIVE NEGATIVE   Ketones, ur 80 (A) NEGATIVE mg/dL   Protein, ur 100 (A) NEGATIVE mg/dL   Nitrite NEGATIVE NEGATIVE   Leukocytes, UA NEGATIVE NEGATIVE   RBC / HPF 11-20 0 - 5 RBC/hpf   WBC, UA 0-5 0 - 5 WBC/hpf   Bacteria, UA NONE SEEN NONE SEEN   Squamous Epithelial / LPF 0-5 0 - 5   Mucus PRESENT     Comment: Performed at [facility], [place] [address] [place], [place] [address]  CBG monitoring, ED     Status: Abnormal   Collection Time: 06/28/18 10:15 PM  Result Value Ref Range   Glucose-Capillary 165 (H) 70 - 99 mg/dL  CBG  monitoring, ED     Status: Abnormal   Collection Time: 06/28/18 11:20 PM  Result Value Ref Range   Glucose-Capillary 164 (H) 70 - 99 mg/dL   Ct Abdomen Pelvis W Contrast  Result Date: 06/28/2018 CLINICAL DATA:  27 year old male with acute RIGHT abdominal and pelvic pain with vomiting today. EXAM: CT ABDOMEN AND PELVIS WITH CONTRAST TECHNIQUE: Multidetector CT imaging of the abdomen and pelvis was performed using the standard protocol following bolus administration of intravenous contrast. CONTRAST:  126m ISOVUE-300 IOPAMIDOL (ISOVUE-300) INJECTION 61% COMPARISON:  None. FINDINGS: Lower chest: No acute abnormality Hepatobiliary: The liver and gallbladder are unremarkable. No biliary dilatation. Pancreas: Unremarkable Spleen: Unremarkable Adrenals/Urinary Tract: The kidneys, adrenal glands and bladder are unremarkable. Stomach/Bowel: An enlarged appendix is noted compatible with appendicitis. A 6 mm appendicolith at the appendiceal origin is noted. No free fluid, abscess, bowel obstruction or pneumoperitoneum. No other bowel abnormalities are identified. Vascular/Lymphatic: No significant vascular findings are present. No enlarged abdominal or pelvic lymph nodes. Reproductive: Prostate is unremarkable. Other: None Musculoskeletal: No acute or suspicious bony abnormalities. 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 IMPRESSION: Appendicitis without complicating features. 6 mm appendicolith at the appendiceal origin. No free fluid, abscess or pneumoperitoneum. Electronically Signed   By: JMargarette CanadaM.D.   On: 06/28/2018 21:29    Pending Labs Unresulted Labs (From admission, onward)   None      Vitals/Pain Today's Vitals   06/28/18 2203 06/28/18 2230 06/28/18 2300 06/28/18 2320  BP:  (!) 159/67 (!) 148/55   Pulse:  (!) 102 (!) 104   Resp:   20   Temp:      TempSrc:      SpO2:  99% 99%   Weight:      Height:      PainSc: 1    0-No pain    Isolation Precautions No active  isolations  Medications Medications  morphine 4 MG/ML injection 4 mg (4 mg Intravenous Not Given 06/28/18 2203)  cefTRIAXone (ROCEPHIN) 2 g in sodium chloride 0.9 % 100 mL IVPB (0 g Intravenous Stopped 06/28/18 2317)    And  metroNIDAZOLE (FLAGYL) IVPB 500 mg (500 mg Intravenous New Bag/Given 06/28/18 2319)  ondansetron (ZOFRAN-ODT) disintegrating tablet  4 mg (has no administration in time range)    Or  ondansetron (ZOFRAN) injection 4 mg (has no administration in time range)  iopamidol (ISOVUE-300) 61 % injection 100 mL (100 mLs Intravenous Contrast Given 06/28/18 2047)  sodium chloride 0.9 % bolus 1,000 mL (1,000 mLs Intravenous New Bag/Given 06/28/18 2202)    Mobility walks

## 2018-06-28 NOTE — ED Notes (Signed)
Pt reports he at french fries at 2000 tonight and had water at 1800. Pt has an insulin pump and is concerned about his sugar, will check glucose

## 2018-06-28 NOTE — H&P (Signed)
Brett Lee is an 27 y.o. male.   Chief Complaint: abd pain HPI: 74 yom with several days of loose stools but then today developed periumbilical abd pain. This was not getting better. Nothing was making better. Hurt to touch and move. Had n/v. No fever. Went to urgent care and referred to er.  Underwent ct here and appears to be appendicitis.  Is type I dm with pump. Ate fries at 8 pm tonight.    Past Medical History:  Diagnosis Date  . Allergy   . Diabetes mellitus without complication ([place])     No past surgical history on file.  No family history on file. Social History:  reports that he has never smoked. He has never used smokeless tobacco. He reports that he drinks about 2.0 standard drinks of alcohol per week. He reports that he does not use drugs.  Allergies:  Allergies  Allergen Reactions  . Other Other (See Comments)    " I have seasonal allergies, I think pollen and I get a stuffy nose and start sneezing."  Per patient.   meds insulin pump  Results for orders placed or performed during the hospital encounter of 06/28/18 (from the past 48 hour(s))  Lipase, blood     Status: None   Collection Time: 06/28/18  6:44 PM  Result Value Ref Range   Lipase 23 11 - 51 U/L    Comment: Performed at [facility], [place] [address] [place], [place] [address]  Comprehensive metabolic panel     Status: Abnormal   Collection Time: 06/28/18  6:44 PM  Result Value Ref Range   Sodium 139 135 - 145 mmol/L   Potassium 4.1 3.5 - 5.1 mmol/L   Chloride 100 98 - 111 mmol/L   CO2 27 22 - 32 mmol/L   Glucose, Bld 132 (H) 70 - 99 mg/dL   BUN 16 6 - 20 mg/dL   Creatinine, Ser 0.84 0.61 - 1.24 mg/dL   Calcium 9.4 8.9 - 10.3 mg/dL   Total Protein 7.8 6.5 - 8.1 g/dL   Albumin 4.6 3.5 - 5.0 g/dL   AST 160 (H) 15 - 41 U/L   ALT 99 (H) 0 - 44 U/L   Alkaline Phosphatase 60 38 - 126 U/L   Total Bilirubin 0.9 0.3 - 1.2 mg/dL   GFR calc non Af Amer >60 >60 mL/min   GFR calc Af Amer >60  >60 mL/min    Comment: (NOTE) The eGFR has been calculated using the CKD EPI equation. This calculation has not been validated in all clinical situations. eGFR's persistently <60 mL/min signify possible Chronic Kidney Disease.    Anion gap 12 5 - 15    Comment: Performed at [facility], [place] [address] [place], [place] [address]  CBC     Status: Abnormal   Collection Time: 06/28/18  6:44 PM  Result Value Ref Range   WBC 15.3 (H) 4.0 - 10.5 K/uL   RBC 5.16 4.22 - 5.81 MIL/uL   Hemoglobin 15.5 13.0 - 17.0 g/dL   HCT 44.1 39.0 - 52.0 %   MCV 85.5 78.0 - 100.0 fL   MCH 30.0 26.0 - 34.0 pg   MCHC 35.1 30.0 - 36.0 g/dL   RDW 12.6 11.5 - 15.5 %   Platelets 286 150 - 400 K/uL    Comment: Performed at [facility], [place] [address], [place], [place] [address]  Urinalysis, Routine w reflex microscopic     Status:  Abnormal   Collection Time: 06/28/18  8:14 PM  Result Value Ref Range   Color, Urine AMBER (A) YELLOW    Comment: BIOCHEMICALS MAY BE AFFECTED BY COLOR   APPearance HAZY (A) CLEAR   Specific Gravity, Urine 1.033 (H) 1.005 - 1.030   pH 5.0 5.0 - 8.0   Glucose, UA NEGATIVE NEGATIVE mg/dL   Hgb urine dipstick SMALL (A) NEGATIVE   Bilirubin Urine NEGATIVE NEGATIVE   Ketones, ur 80 (A) NEGATIVE mg/dL   Protein, ur 100 (A) NEGATIVE mg/dL   Nitrite NEGATIVE NEGATIVE   Leukocytes, UA NEGATIVE NEGATIVE   RBC / HPF 11-20 0 - 5 RBC/hpf   WBC, UA 0-5 0 - 5 WBC/hpf   Bacteria, UA NONE SEEN NONE SEEN   Squamous Epithelial / LPF 0-5 0 - 5   Mucus PRESENT     Comment: Performed at [facility], [place] [address] [place], [place] [address]  CBG monitoring, ED     Status: Abnormal   Collection Time: 06/28/18 10:15 PM  Result Value Ref Range   Glucose-Capillary 165 (H) 70 - 99 mg/dL   Ct Abdomen Pelvis W Contrast  Result Date: 06/28/2018 CLINICAL DATA:  27 year old male with acute RIGHT abdominal and pelvic pain with vomiting today.  EXAM: CT ABDOMEN AND PELVIS WITH CONTRAST TECHNIQUE: Multidetector CT imaging of the abdomen and pelvis was performed using the standard protocol following bolus administration of intravenous contrast. CONTRAST:  16m ISOVUE-300 IOPAMIDOL (ISOVUE-300) INJECTION 61% COMPARISON:  None. FINDINGS: Lower chest: No acute abnormality Hepatobiliary: The liver and gallbladder are unremarkable. No biliary dilatation. Pancreas: Unremarkable Spleen: Unremarkable Adrenals/Urinary Tract: The kidneys, adrenal glands and bladder are unremarkable. Stomach/Bowel: An enlarged appendix is noted compatible with appendicitis. A 6 mm appendicolith at the appendiceal origin is noted. No free fluid, abscess, bowel obstruction or pneumoperitoneum. No other bowel abnormalities are identified. Vascular/Lymphatic: No significant vascular findings are present. No enlarged abdominal or pelvic lymph nodes. Reproductive: Prostate is unremarkable. Other: None Musculoskeletal: No acute or suspicious bony abnormalities. 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 IMPRESSION: Appendicitis without complicating features. 6 mm appendicolith at the appendiceal origin. No free fluid, abscess or pneumoperitoneum. Electronically Signed   By: JMargarette CanadaM.D.   On: 06/28/2018 21:29    Review of Systems  Gastrointestinal: Positive for abdominal pain, nausea and vomiting.    Blood pressure (!) 126/58, pulse (!) 103, temperature 98.9 F (37.2 C), temperature source Oral, resp. rate 17, height 5' 10"  (1.778 m), weight 92.5 kg, SpO2 98 %. Physical Exam  Vitals reviewed. Constitutional: He is oriented to person, place, and time. He appears well-developed and well-nourished.  HENT:  Head: Normocephalic and atraumatic.  Right Ear: External ear normal.  Left Ear: External ear normal.  Eyes: Pupils are equal, round, and reactive to light. EOM are normal. No scleral icterus.  Neck: Neck supple.  Cardiovascular: Normal rate and regular  rhythm.  Respiratory: Effort normal and breath sounds normal.  GI: Soft. There is tenderness in the right lower quadrant and suprapubic area. No hernia.  Lymphadenopathy:    He has no cervical adenopathy.  Neurological: He is alert and oriented to person, place, and time.  Skin: Skin is warm and dry. He is not diaphoretic.  Psychiatric: His behavior is normal. Thought content normal.     Assessment/Plan Appendicitis- receiving abx now, npo after mn, plan for lap appy in am, poss dc tomorrow DM, type I- continue insulin pump and cbgs   MRolm Bookbinder MD  06/28/2018, 10:49 PM

## 2018-06-29 ENCOUNTER — Encounter (HOSPITAL_COMMUNITY): Payer: Self-pay | Admitting: Certified Registered Nurse Anesthetist

## 2018-06-29 ENCOUNTER — Encounter (HOSPITAL_COMMUNITY)
Admission: EM | Disposition: A | Discharge status: Home / Self Care | Payer: Self-pay | Source: Home / Self Care | Attending: Emergency Medicine

## 2018-06-29 ENCOUNTER — Observation Stay (HOSPITAL_COMMUNITY): Payer: Commercial Managed Care - PPO | Admitting: Certified Registered Nurse Anesthetist

## 2018-06-29 HISTORY — PX: LAPAROSCOPIC APPENDECTOMY: SHX408

## 2018-06-29 LAB — GLUCOSE, CAPILLARY
GLUCOSE-CAPILLARY: 164 mg/dL — AB (ref 70–99)
GLUCOSE-CAPILLARY: 173 mg/dL — AB (ref 70–99)
GLUCOSE-CAPILLARY: 232 mg/dL — AB (ref 70–99)
Glucose-Capillary: 210 mg/dL — ABNORMAL HIGH (ref 70–99)
Glucose-Capillary: 106 mg/dL — ABNORMAL HIGH (ref 70–99)
Glucose-Capillary: 93 mg/dL (ref 70–99)

## 2018-06-29 LAB — CBG MONITORING, ED
Glucose-Capillary: 177 mg/dL — ABNORMAL HIGH (ref 70–99)
Glucose-Capillary: 195 mg/dL — ABNORMAL HIGH (ref 70–99)

## 2018-06-29 SURGERY — APPENDECTOMY, LAPAROSCOPIC
Anesthesia: General

## 2018-06-29 MED ORDER — BUPIVACAINE-EPINEPHRINE (PF) 0.5% -1:200000 IJ SOLN
INTRAMUSCULAR | Status: AC
  Filled 2018-06-29: qty 30

## 2018-06-29 MED ORDER — SIMETHICONE 80 MG PO CHEW
40.0000 mg | CHEWABLE_TABLET | Freq: Four times a day (QID) | ORAL | Status: DC | PRN
  Filled 2018-06-29: qty 1

## 2018-06-29 MED ORDER — ONDANSETRON HCL 4 MG/2ML IJ SOLN
INTRAMUSCULAR | Status: AC
  Filled 2018-06-29: qty 2

## 2018-06-29 MED ORDER — ROCURONIUM BROMIDE 50 MG/5ML IV SOSY
PREFILLED_SYRINGE | INTRAVENOUS | Status: DC | PRN
  Administered 2018-06-29: 40 mg via INTRAVENOUS

## 2018-06-29 MED ORDER — ACETAMINOPHEN 650 MG RE SUPP: 650.0000 mg | Freq: Four times a day (QID) | RECTAL | Status: DC | PRN

## 2018-06-29 MED ORDER — SODIUM CHLORIDE 0.9 % IV SOLN
2.0000 g | INTRAVENOUS | Status: DC
  Administered 2018-06-29: 2 g via INTRAVENOUS
  Filled 2018-06-29: qty 2

## 2018-06-29 MED ORDER — MORPHINE SULFATE (PF) 2 MG/ML IV SOLN: 2.0000 mg | INTRAVENOUS | Status: DC | PRN

## 2018-06-29 MED ORDER — CELECOXIB 200 MG PO CAPS
400.0000 mg | ORAL_CAPSULE | ORAL | Status: AC
  Administered 2018-06-29: 400 mg via ORAL
  Filled 2018-06-29: qty 2

## 2018-06-29 MED ORDER — METRONIDAZOLE IN NACL 5-0.79 MG/ML-% IV SOLN
500.0000 mg | Freq: Three times a day (TID) | INTRAVENOUS | Status: DC
  Administered 2018-06-29 – 2018-06-30 (×2): 500 mg via INTRAVENOUS
  Filled 2018-06-29 (×2): qty 100

## 2018-06-29 MED ORDER — ACETAMINOPHEN 500 MG PO TABS
1000.0000 mg | ORAL_TABLET | ORAL | Status: AC
  Administered 2018-06-29: 1000 mg via ORAL
  Filled 2018-06-29: qty 2

## 2018-06-29 MED ORDER — GABAPENTIN 100 MG PO CAPS
100.0000 mg | ORAL_CAPSULE | ORAL | Status: AC
  Administered 2018-06-29: 100 mg via ORAL
  Filled 2018-06-29: qty 1

## 2018-06-29 MED ORDER — OXYCODONE HCL 5 MG/5ML PO SOLN: 5.0000 mg | Freq: Once | ORAL | Status: DC | PRN

## 2018-06-29 MED ORDER — FENTANYL CITRATE (PF) 100 MCG/2ML IJ SOLN: 25.0000 ug | INTRAMUSCULAR | Status: DC | PRN

## 2018-06-29 MED ORDER — INSULIN ASPART 100 UNIT/ML ~~LOC~~ SOLN
8.0000 [IU] | Freq: Once | SUBCUTANEOUS | Status: AC
  Administered 2018-06-29: 8 [IU] via SUBCUTANEOUS

## 2018-06-29 MED ORDER — ROCURONIUM BROMIDE 10 MG/ML (PF) SYRINGE
PREFILLED_SYRINGE | INTRAVENOUS | Status: AC
  Filled 2018-06-29: qty 10

## 2018-06-29 MED ORDER — PROPOFOL 10 MG/ML IV BOLUS
INTRAVENOUS | Status: DC | PRN
  Administered 2018-06-29: 200 mg via INTRAVENOUS

## 2018-06-29 MED ORDER — PROPOFOL 10 MG/ML IV BOLUS
INTRAVENOUS | Status: AC
  Filled 2018-06-29: qty 20

## 2018-06-29 MED ORDER — SODIUM CHLORIDE 0.9 % IV SOLN
INTRAVENOUS | Status: DC
  Administered 2018-06-29 – 2018-06-30 (×3): via INTRAVENOUS

## 2018-06-29 MED ORDER — OXYCODONE HCL 5 MG PO TABS: 5.0000 mg | ORAL_TABLET | Freq: Once | ORAL | Status: DC | PRN

## 2018-06-29 MED ORDER — OXYCODONE HCL 5 MG PO TABS
5.0000 mg | ORAL_TABLET | ORAL | Status: DC | PRN
  Administered 2018-06-29: 5 mg via ORAL
  Filled 2018-06-29: qty 1

## 2018-06-29 MED ORDER — BUPIVACAINE-EPINEPHRINE 0.25% -1:200000 IJ SOLN
INTRAMUSCULAR | Status: DC | PRN
  Administered 2018-06-29: 30 mL

## 2018-06-29 MED ORDER — FENTANYL CITRATE (PF) 250 MCG/5ML IJ SOLN
INTRAMUSCULAR | Status: AC
  Filled 2018-06-29: qty 5

## 2018-06-29 MED ORDER — LACTATED RINGERS IV SOLN
INTRAVENOUS | Status: DC
  Administered 2018-06-29: 08:00:00 via INTRAVENOUS

## 2018-06-29 MED ORDER — MIDAZOLAM HCL 2 MG/2ML IJ SOLN
INTRAMUSCULAR | Status: AC
  Filled 2018-06-29: qty 2

## 2018-06-29 MED ORDER — SODIUM CHLORIDE 0.9 % IV SOLN: 2.0000 g | Freq: Once | INTRAVENOUS | Status: DC

## 2018-06-29 MED ORDER — KETOROLAC TROMETHAMINE 15 MG/ML IJ SOLN: 15.0000 mg | Freq: Four times a day (QID) | INTRAMUSCULAR | Status: DC | PRN

## 2018-06-29 MED ORDER — SUGAMMADEX SODIUM 200 MG/2ML IV SOLN
INTRAVENOUS | Status: DC | PRN
  Administered 2018-06-29: 200 mg via INTRAVENOUS

## 2018-06-29 MED ORDER — MORPHINE SULFATE (PF) 2 MG/ML IV SOLN
2.0000 mg | INTRAVENOUS | Status: DC | PRN
  Administered 2018-06-29 (×2): 2 mg via INTRAVENOUS
  Filled 2018-06-29 (×2): qty 1

## 2018-06-29 MED ORDER — METRONIDAZOLE IN NACL 5-0.79 MG/ML-% IV SOLN: 500.0000 mg | Freq: Once | INTRAVENOUS | Status: DC

## 2018-06-29 MED ORDER — LIDOCAINE 2% (20 MG/ML) 5 ML SYRINGE
INTRAMUSCULAR | Status: AC
  Filled 2018-06-29: qty 5

## 2018-06-29 MED ORDER — ONDANSETRON HCL 4 MG/2ML IJ SOLN: 4.0000 mg | Freq: Four times a day (QID) | INTRAMUSCULAR | Status: DC | PRN

## 2018-06-29 MED ORDER — INSULIN PUMP
Freq: Three times a day (TID) | SUBCUTANEOUS | Status: DC
  Administered 2018-06-29: 02:00:00 via SUBCUTANEOUS
  Filled 2018-06-29: qty 1

## 2018-06-29 MED ORDER — SUGAMMADEX SODIUM 200 MG/2ML IV SOLN
INTRAVENOUS | Status: AC
  Filled 2018-06-29: qty 2

## 2018-06-29 MED ORDER — SUGAMMADEX SODIUM 200 MG/2ML IV SOLN: INTRAVENOUS | Status: DC | PRN

## 2018-06-29 MED ORDER — FENTANYL CITRATE (PF) 100 MCG/2ML IJ SOLN
INTRAMUSCULAR | Status: DC | PRN
  Administered 2018-06-29: 100 ug via INTRAVENOUS
  Administered 2018-06-29 (×3): 50 ug via INTRAVENOUS

## 2018-06-29 MED ORDER — ONDANSETRON HCL 4 MG/2ML IJ SOLN
INTRAMUSCULAR | Status: DC | PRN
  Administered 2018-06-29: 4 mg via INTRAVENOUS

## 2018-06-29 MED ORDER — LIDOCAINE 2% (20 MG/ML) 5 ML SYRINGE
INTRAMUSCULAR | Status: DC | PRN
  Administered 2018-06-29: 100 mg via INTRAVENOUS

## 2018-06-29 MED ORDER — LACTATED RINGERS IR SOLN
Status: DC | PRN
  Administered 2018-06-29: 1000 mL

## 2018-06-29 MED ORDER — KETAMINE HCL 10 MG/ML IJ SOLN
INTRAMUSCULAR | Status: AC
  Filled 2018-06-29: qty 1

## 2018-06-29 MED ORDER — SUCCINYLCHOLINE CHLORIDE 200 MG/10ML IV SOSY
PREFILLED_SYRINGE | INTRAVENOUS | Status: AC
  Filled 2018-06-29: qty 10

## 2018-06-29 MED ORDER — KETAMINE HCL 10 MG/ML IJ SOLN
INTRAMUSCULAR | Status: DC | PRN
  Administered 2018-06-29: 30 mg via INTRAVENOUS

## 2018-06-29 MED ORDER — TRAMADOL HCL 50 MG PO TABS: 100.0000 mg | ORAL_TABLET | Freq: Two times a day (BID) | ORAL | Status: DC | PRN

## 2018-06-29 MED ORDER — DEXAMETHASONE SODIUM PHOSPHATE 10 MG/ML IJ SOLN
INTRAMUSCULAR | Status: AC
  Filled 2018-06-29: qty 1

## 2018-06-29 MED ORDER — MIDAZOLAM HCL 5 MG/5ML IJ SOLN
INTRAMUSCULAR | Status: DC | PRN
  Administered 2018-06-29: 2 mg via INTRAVENOUS

## 2018-06-29 MED ORDER — SUCCINYLCHOLINE CHLORIDE 200 MG/10ML IV SOSY
PREFILLED_SYRINGE | INTRAVENOUS | Status: DC | PRN
  Administered 2018-06-29: 100 mg via INTRAVENOUS

## 2018-06-29 MED ORDER — ACETAMINOPHEN 325 MG PO TABS
650.0000 mg | ORAL_TABLET | Freq: Four times a day (QID) | ORAL | Status: DC | PRN
  Administered 2018-06-29: 650 mg via ORAL
  Filled 2018-06-29: qty 2

## 2018-06-29 SURGICAL SUPPLY — 35 items
APPLIER CLIP ROT 10 11.4 M/L (STAPLE)
BENZOIN TINCTURE PRP APPL 2/3 (GAUZE/BANDAGES/DRESSINGS) IMPLANT
CABLE HIGH FREQUENCY MONO STRZ (ELECTRODE) ×3 IMPLANT
CHLORAPREP W/TINT 26ML (MISCELLANEOUS) ×3 IMPLANT
CLIP APPLIE ROT 10 11.4 M/L (STAPLE) IMPLANT
CLOSURE WOUND 1/2 X4 (GAUZE/BANDAGES/DRESSINGS)
COVER SURGICAL LIGHT HANDLE (MISCELLANEOUS) ×3 IMPLANT
CUTTER FLEX LINEAR 45M (STAPLE) ×3 IMPLANT
DECANTER SPIKE VIAL GLASS SM (MISCELLANEOUS) ×3 IMPLANT
DERMABOND ADVANCED (GAUZE/BANDAGES/DRESSINGS) ×2
DERMABOND ADVANCED .7 DNX12 (GAUZE/BANDAGES/DRESSINGS) ×1 IMPLANT
DRAPE LAPAROSCOPIC ABDOMINAL (DRAPES) ×3 IMPLANT
ELECT REM PT RETURN 15FT ADLT (MISCELLANEOUS) ×3 IMPLANT
ENDOLOOP SUT PDS II  0 18 (SUTURE)
ENDOLOOP SUT PDS II 0 18 (SUTURE) IMPLANT
GLOVE SURG SIGNA 7.5 PF LTX (GLOVE) ×18 IMPLANT
GOWN STRL REUS W/TWL XL LVL3 (GOWN DISPOSABLE) ×9 IMPLANT
KIT BASIN OR (CUSTOM PROCEDURE TRAY) ×3 IMPLANT
POUCH RETRIEVAL ECOSAC 10 (ENDOMECHANICALS) ×1 IMPLANT
POUCH RETRIEVAL ECOSAC 10MM (ENDOMECHANICALS) ×2
POUCH SPECIMEN RETRIEVAL 10MM (ENDOMECHANICALS) IMPLANT
RELOAD 45 VASCULAR/THIN (ENDOMECHANICALS) IMPLANT
RELOAD STAPLE TA45 3.5 REG BLU (ENDOMECHANICALS) ×3 IMPLANT
SCISSORS LAP 5X35 DISP (ENDOMECHANICALS) ×3 IMPLANT
SET IRRIG TUBING LAPAROSCOPIC (IRRIGATION / IRRIGATOR) ×3 IMPLANT
SHEARS HARMONIC ACE PLUS 36CM (ENDOMECHANICALS) ×3 IMPLANT
SLEEVE XCEL OPT CAN 5 100 (ENDOMECHANICALS) ×3 IMPLANT
STRIP CLOSURE SKIN 1/2X4 (GAUZE/BANDAGES/DRESSINGS) IMPLANT
SUT MNCRL AB 4-0 PS2 18 (SUTURE) ×3 IMPLANT
SUT VIC AB 2-0 SH 18 (SUTURE) IMPLANT
TOWEL OR 17X26 10 PK STRL BLUE (TOWEL DISPOSABLE) ×3 IMPLANT
TOWEL OR NON WOVEN STRL DISP B (DISPOSABLE) ×3 IMPLANT
TRAY LAPAROSCOPIC (CUSTOM PROCEDURE TRAY) ×3 IMPLANT
TROCAR BLADELESS OPT 5 100 (ENDOMECHANICALS) ×3 IMPLANT
TROCAR XCEL BLUNT TIP 100MML (ENDOMECHANICALS) ×3 IMPLANT

## 2018-06-29 NOTE — Progress Notes (Signed)
Brother, Brett Lee, and girlfriend, Angelique Blonder, at bedside.  His mother is in [place] and father in [place].  Father is on the way.  He works in patient transport at [employer], but lives in [place].  Dr. Crista Curb is his endocrinologist - he just established with him about a month ago.  I discussed with the patient the indications and risks of appendiceal surgery.  The primary risks of appendiceal surgery include, but are not limited to, bleeding, infection, bowel surgery, and open surgery.  There is also the risk that the patient may have continued symptoms after surgery.  We discussed the typical post-operative recovery course. I tried to answer the patient's questions.  To OR this AM.  Ovidio Kin, MD, [facility] Surgery Pager: [phone] Office phone:  [phone]

## 2018-06-29 NOTE — Transfer of Care (Signed)
Immediate Anesthesia Transfer of Care Note  Patient: Brett Lee  Procedure(s) Performed: APPENDECTOMY LAPAROSCOPIC (N/A )  Patient Location: PACU  Anesthesia Type:General  Level of Consciousness: awake, alert  and oriented  Airway & Oxygen Therapy: Patient Spontanous Breathing and Patient connected to face mask oxygen  Post-op Assessment: Report given to RN and Post -op Vital signs reviewed and stable  Post vital signs: Reviewed and stable  Last Vitals:  Vitals Value Taken Time  BP 137/82 06/29/2018  9:46 AM  Temp    Pulse 109 06/29/2018  9:49 AM  Resp 21 06/29/2018  9:49 AM  SpO2 100 % 06/29/2018  9:49 AM  Vitals shown include unvalidated device data.  Last Pain:  Vitals:   06/29/18 0751  TempSrc: Oral  PainSc:          Complications: No apparent anesthesia complications

## 2018-06-29 NOTE — ED Notes (Signed)
Patient out of ED via ortho tech in no distress.

## 2018-06-29 NOTE — ED Notes (Signed)
Consent signed,  CHG bath given.

## 2018-06-29 NOTE — Anesthesia Preprocedure Evaluation (Signed)
Anesthesia Evaluation  Patient identified by MRN, date of birth, ID band Patient awake    Reviewed: Allergy & Precautions, H&P , NPO status , Patient's Chart, lab work & pertinent test results  Airway Mallampati: II   Neck ROM: full    Dental   Pulmonary neg pulmonary ROS,    breath sounds clear to auscultation       Cardiovascular negative cardio ROS   Rhythm:regular Rate:Normal     Neuro/Psych    GI/Hepatic appendicitis   Endo/Other  diabetes, Insulin Dependent  Renal/GU      Musculoskeletal   Abdominal   Peds  Hematology   Anesthesia Other Findings   Reproductive/Obstetrics                            Anesthesia Physical Anesthesia Plan  ASA: III  Anesthesia Plan: General   Post-op Pain Management:    Induction: Intravenous, Rapid sequence and Cricoid pressure planned  PONV Risk Score and Plan: 2 and Ondansetron, Midazolam and Treatment may vary due to age or medical condition  Airway Management Planned: Oral ETT  Additional Equipment:   Intra-op Plan:   Post-operative Plan: Extubation in OR  Informed Consent: I have reviewed the patients History and Physical, chart, labs and discussed the procedure including the risks, benefits and alternatives for the proposed anesthesia with the patient or authorized representative who has indicated his/her understanding and acceptance.     Plan Discussed with: CRNA, Anesthesiologist and Surgeon  Anesthesia Plan Comments:         Anesthesia Quick Evaluation

## 2018-06-29 NOTE — Op Note (Signed)
Re:   [name] DOB:   24-Feb-1991 MRN:   [id number]                   FACILITY:  [facility]  DATE OF PROCEDURE: 06/29/2018                              OPERATIVE REPORT  PREOPERATIVE DIAGNOSIS:  Appendicitis  POSTOPERATIVE DIAGNOSIS:  Acute supprative appendicitis.  PROCEDURE:  Laparoscopic appendectomy.  SURGEON:  Sandria Bales. Ezzard Standing, MD  ASSISTANT:  No first assistant.  ANESTHESIA:  General endotracheal.  Anesthesiologist: Achille Rich, MD CRNA: Orest Dikes, CRNA  ASA:  3E  ESTIMATED BLOOD LOSS:  Minimal.  DRAINS: none   SPECIMEN:   Appendix  COUNTS CORRECT:  YES  INDICATIONS FOR PROCEDURE: Brett Lee is a 27 y.o. (DOB: [birth year]) white male whose primary care doctor is Medicine, [facility] and comes to the OR for an appendectomy.   I discussed with the patient, the indications and potential complications of appendiceal surgery.  The potential complications include, but are not limited to, bleeding, open surgery, bowel resection, and the possibility of another diagnosis.  OPERATIVE NOTE:  The patient underwent a general endotracheal anesthetic as supervised by Anesthesiologist: Achille Rich, MD CRNA: Orest Dikes, CRNA, General, in OR room #2 at [facility].  The patient was given Rocephin/Flagyl prior to the beginning of the procedure and the abdomen was prepped with ChloraPrep.   A time-out was held and surgical checklist run.  An infraumbilical incision was made with sharp dissection carried down to the abdominal cavity.  An 12 mm Hasson trocar was inserted through the infraumbilical incision and into the peritoneal cavity.  A 30 degree 5 mm laparoscope was inserted through a 12 mm Hasson trocar and the Hasson trocar secured with a 0 Vicryl suture.  I placed a 5 mm trocar in the right upper quadrant and a 5 mm torcar in left lower quadrant and did abdominal exploration.    The right and left lobes of liver unremarkable.  Stomach was  unremarkable.  The pelvic organs were unremarkable.  I saw no other intra-abdominal abnormality.  The patient had purlulent appendicitis with the appendix covered in pus.  But it had not ruptured.  The appendix located medial to the ceum and underneath the terminal ileum.  The appendix was not ruptured.  The mesentery of the appendix was divided with a Harmonic scalpel.  I got to the base of the appendix.  I then used a blue load 45 mm Ethicon Endo-GIA stapler and fired this across the base of the appendix.  I placed the appendix in EndoCatch bag and delivered the bag through the umbilical incision.  I irrigated the abdomen with 800 cc of saline.  After irrigating the abdomen, I then removed the trocars, in turn.  The umbilical port fascia was closed with 0 Vicryl suture.   I closed the skin each site with a 4-0 Monocryl suture and painted the wounds with DermaBond.  I then injected a total of 30 mL of 0.25% Marcaine at the incisions.  Sponge and needle count were correct at the end of the case.  The patient was transferred to the recovery room in good condition.  The patient tolerated the procedure well and it depends on the patient's post op clinical course as to when the patient could be discharged.   Ovidio Kin, MD, [facility]  Surgery Pager: [phone] Office phone:  [phone]

## 2018-06-29 NOTE — Discharge Instructions (Signed)

## 2018-06-29 NOTE — Anesthesia Postprocedure Evaluation (Signed)
Anesthesia Post Note  Patient: Brett Lee  Procedure(s) Performed: APPENDECTOMY LAPAROSCOPIC (N/A )     Patient location during evaluation: PACU Anesthesia Type: General Level of consciousness: awake and alert Pain management: pain level controlled Vital Signs Assessment: post-procedure vital signs reviewed and stable Respiratory status: spontaneous breathing, nonlabored ventilation, respiratory function stable and patient connected to nasal cannula oxygen Cardiovascular status: blood pressure returned to baseline and stable Postop Assessment: no apparent nausea or vomiting Anesthetic complications: no    Last Vitals:  Vitals:   06/29/18 1245 06/29/18 1318  BP:  140/65  Pulse:  90  Resp:  16  Temp: 37.3 C 37.2 C  SpO2:  99%    Last Pain:  Vitals:   06/29/18 1318  TempSrc: Oral  PainSc: 2                  Kieryn Burtis S

## 2018-06-29 NOTE — Anesthesia Procedure Notes (Signed)
Procedure Name: Intubation Date/Time: 06/29/2018 8:39 AM Performed by: Maxwell Caul, CRNA Pre-anesthesia Checklist: Patient identified, Emergency Drugs available, Suction available and Patient being monitored Patient Re-evaluated:Patient Re-evaluated prior to induction Oxygen Delivery Method: Circle system utilized Preoxygenation: Pre-oxygenation with 100% oxygen Induction Type: IV induction, Rapid sequence and Cricoid Pressure applied Laryngoscope Size: Mac and 4 Grade View: Grade I Tube type: Oral Tube size: 7.5 mm Number of attempts: 1 Airway Equipment and Method: Stylet Placement Confirmation: ETT inserted through vocal cords under direct vision,  positive ETCO2 and breath sounds checked- equal and bilateral Secured at: 21 cm Tube secured with: Tape Dental Injury: Teeth and Oropharynx as per pre-operative assessment

## 2018-06-30 ENCOUNTER — Encounter: Payer: Self-pay | Admitting: General Surgery

## 2018-06-30 ENCOUNTER — Encounter (HOSPITAL_COMMUNITY): Payer: Self-pay | Admitting: Surgery

## 2018-06-30 LAB — GLUCOSE, CAPILLARY
GLUCOSE-CAPILLARY: 68 mg/dL — AB (ref 70–99)
GLUCOSE-CAPILLARY: 74 mg/dL (ref 70–99)
Glucose-Capillary: 135 mg/dL — ABNORMAL HIGH (ref 70–99)

## 2018-06-30 MED ORDER — IBUPROFEN 800 MG PO TABS: 800.0000 mg | ORAL_TABLET | Freq: Three times a day (TID) | ORAL | 0 refills | Status: AC | PRN

## 2018-06-30 MED ORDER — HYDROCODONE-ACETAMINOPHEN 5-325 MG PO TABS: 1.0000 | ORAL_TABLET | Freq: Four times a day (QID) | ORAL | 0 refills | Status: AC | PRN

## 2018-06-30 NOTE — Discharge Summary (Signed)
Physician Discharge Summary  Brett Lee ZOX:[id number] DOB: Brett Lee DOA: 06/28/2018  PCP: Medicine, [facility]  Admit date: 06/28/2018 Discharge date: 06/30/2018  Recommendations for Outpatient Follow-up:  1.  (include homehealth, outpatient follow-up instructions, specific recommendations for PCP to follow-up on, etc.)  Follow-up Information    [facility], [place]. Go on 07/12/2018.   Specialty:  General Surgery Why:  Your appointment is 09/19 at 2 pm Please arrive 30 minutes prior to your appointment to check in and fill out paperwork. Bring photo ID and insurance information. Contact information: [address] [place] [address] [phone]       Medicine, [facility]. Call.   Specialty:  Pediatric Gastroenterology Why:  call to make an appointment to recheck liver enzymes with blood work         Discharge Diagnoses:  Active Problems:   Appendicitis   Surgical Procedure: lap appendectomy  Discharge Condition: Good Disposition: Home  Diet recommendation: carb mod diet   Hospital Course:  27 yo male with diabetes presented for acute appendicitis to the ER. HE was taken for lap appendectomy and found to have gangrenous non-perforated appendicitis. He did well post op tolerating a diet and ambulating. He was discharged home POD 1.  Discharge Instructions  Discharge Instructions    Call MD for:  difficulty breathing, headache or visual disturbances   Complete by:  As directed    Call MD for:  hives   Complete by:  As directed    Call MD for:  persistant nausea and vomiting   Complete by:  As directed    Call MD for:  redness, tenderness, or signs of infection (pain, swelling, redness, odor or green/yellow discharge around incision site)   Complete by:  As directed    Call MD for:  severe uncontrolled pain   Complete by:  As directed    Call MD for:  temperature >100.4   Complete by:   As directed    Diet - low sodium heart healthy   Complete by:  As directed    Discharge wound care:   Complete by:  As directed    Ok to shower tomorrow. Glue will likely peel off in 1-3 weeks. No bandage required   Driving Restrictions   Complete by:  As directed    No driving while on narcotics   Increase activity slowly   Complete by:  As directed    Lifting restrictions   Complete by:  As directed    No lifting greater than 20 pounds for 3 weeks     Allergies as of 06/30/2018      Reactions   Other Other (See Comments)   " I have seasonal allergies, I think pollen and I get a stuffy nose and start sneezing."  Per patient.      Medication List    TAKE these medications   glucagon 1 MG injection Inject 1 mg into the skin daily as needed (low blood sugar).   HUMALOG 100 UNIT/ML injection Generic drug:  insulin lispro USE WITH INSULIN PUMP AS DIRECTED: BASAL RATE 1.65 UNITS/HR WITH 8 UNIT BOLUS FOR CARBS. AVG 90/DAY   HYDROcodone-acetaminophen 5-325 MG tablet Commonly known as:  NORCO/VICODIN Take 1 tablet by mouth every 6 (six) hours as needed for moderate pain.   ibuprofen 800 MG tablet Commonly known as:  ADVIL,MOTRIN Take 1 tablet (800 mg total) by mouth every 8 (eight) hours as needed.   valACYclovir  1000 MG tablet Commonly known as:  VALTREX Take 2 tabs every 12 hours for 1 day What changed:    how much to take  how to take this  when to take this  reasons to take this            Discharge Care Instructions  (From admission, onward)         Start     Ordered   06/30/18 0000  Discharge wound care:    Comments:  Ok to shower tomorrow. Glue will likely peel off in 1-3 weeks. No bandage required   06/30/18 0917         Follow-up Information    [facility] Go on 07/12/2018.   Specialty:  General Surgery Why:  Your appointment is 09/19 at 2 pm Please arrive 30 minutes prior to your appointment to check in and fill out  paperwork. Bring photo ID and insurance information. Contact information: [address] [place] [address] [phone]       Medicine, [facility]. Call.   Specialty:  Pediatric Gastroenterology Why:  call to make an appointment to recheck liver enzymes with blood work           The results of significant diagnostics from this hospitalization (including imaging, microbiology, ancillary and laboratory) are listed below for reference.    Significant Diagnostic Studies: Ct Abdomen Pelvis W Contrast  Result Date: 06/28/2018 CLINICAL DATA:  27 year old male with acute RIGHT abdominal and pelvic pain with vomiting today. EXAM: CT ABDOMEN AND PELVIS WITH CONTRAST TECHNIQUE: Multidetector CT imaging of the abdomen and pelvis was performed using the standard protocol following bolus administration of intravenous contrast. CONTRAST:  ISOVUE-300 IOPAMIDOL (ISOVUE-300) INJECTION 61% COMPARISON:  None. FINDINGS: Lower chest: No acute abnormality Hepatobiliary: The liver and gallbladder are unremarkable. No biliary dilatation. Pancreas: Unremarkable Spleen: Unremarkable Adrenals/Urinary Tract: The kidneys, adrenal glands and bladder are unremarkable. Stomach/Bowel: An enlarged appendix is noted compatible with appendicitis. A 6 mm appendicolith at the appendiceal origin is noted. No free fluid, abscess, bowel obstruction or pneumoperitoneum. No other bowel abnormalities are identified. Vascular/Lymphatic: No significant vascular findings are present. No enlarged abdominal or pelvic lymph nodes. Reproductive: Prostate is unremarkable. Other: None Musculoskeletal: No acute or suspicious bony abnormalities. 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 IMPRESSION: Appendicitis without complicating features. 6 mm appendicolith at the appendiceal origin. No free fluid, abscess or pneumoperitoneum. Electronically Signed   By: Harmon Pier M.D.    On: 06/28/2018 21:29    Labs: Basic Metabolic Panel: Recent Labs  Lab 06/28/18 1844  NA 139  K 4.1  CL 100  CO2 27  GLUCOSE 132*  BUN 16  CREATININE 0.84  CALCIUM 9.4   Liver Function Tests: Recent Labs  Lab 06/28/18 1844  AST 160*  ALT 99*  ALKPHOS 60  BILITOT 0.9  PROT 7.8  ALBUMIN 4.6    CBC: Recent Labs  Lab 06/28/18 1844  WBC 15.3*  HGB 15.5  HCT 44.1  MCV 85.5  PLT 286    CBG: Recent Labs  Lab 06/29/18 1737 06/29/18 2131 06/30/18 0429 06/30/18 0809 06/30/18 0836  GLUCAP 106* 93 135* 68* 74    Active Problems:   Appendicitis   Time coordinating discharge: 

## 2018-10-24 IMAGING — CT CT ABD-PELV W/ CM
2 of 4 series · 16 of 46 positions shown, 18 images · IV contrast (ISOVUE)
Comparison: None.

CLINICAL DATA: 27-year-old male with acute RIGHT abdominal and
pelvic pain with vomiting today.

EXAM:
CT ABDOMEN AND PELVIS WITH CONTRAST
TECHNIQUE: Multidetector CT imaging of the abdomen and pelvis was performed
using the standard protocol following bolus administration of
intravenous contrast.
CONTRAST:  100mL FE4SLP-PRR IOPAMIDOL (FE4SLP-PRR) INJECTION 61%

[Series 2: axial st · axial · 0.69mm/px · z∈[-513,-128]mm · 13 of 87 slices shown, 15 images]
[im 5/87  soft-tissue]
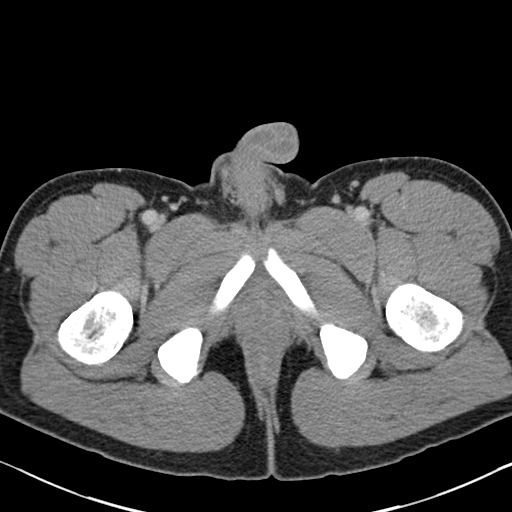
[im 5/87  bone]
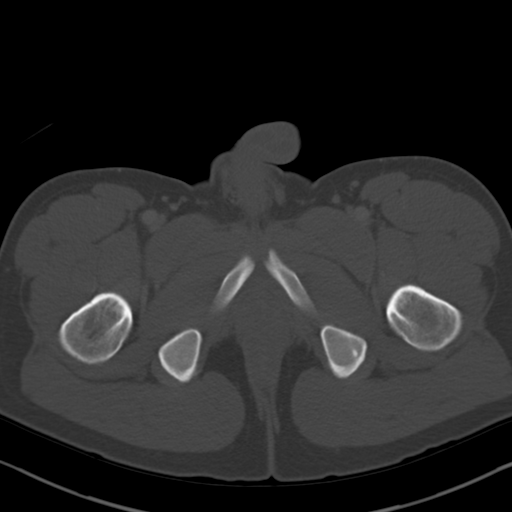
[im 14/87  soft-tissue]
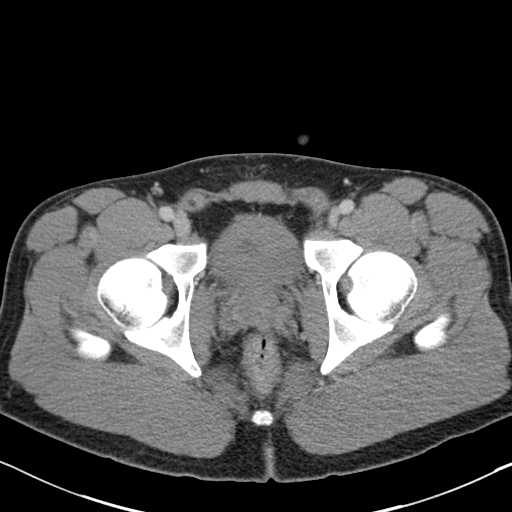
[im 19/87  soft-tissue]
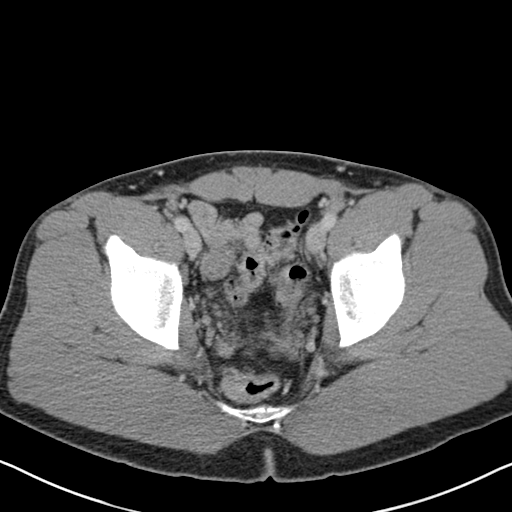
[im 23/87  soft-tissue]
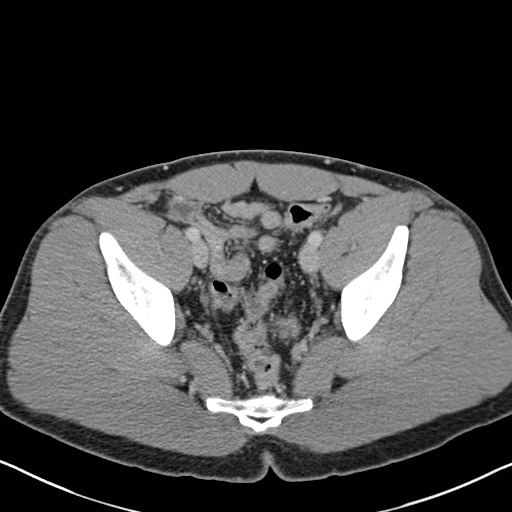
[im 32/87  soft-tissue]
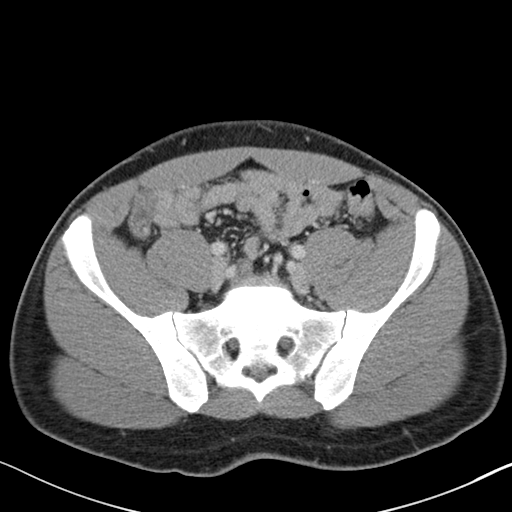
[im 37/87  soft-tissue]
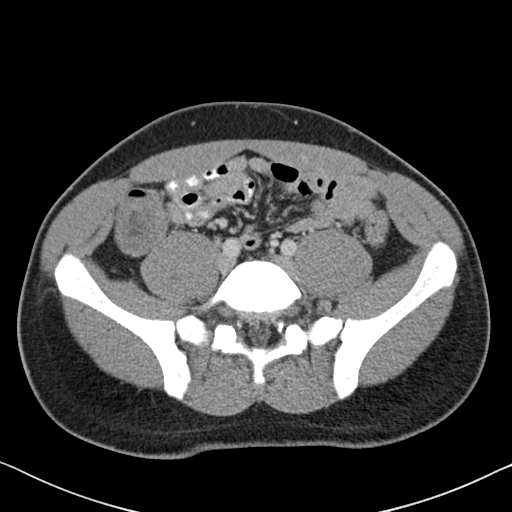
[im 46/87  soft-tissue]
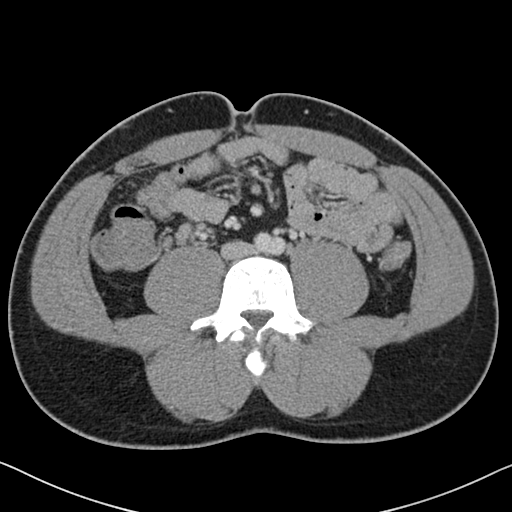
[im 50/87  soft-tissue]
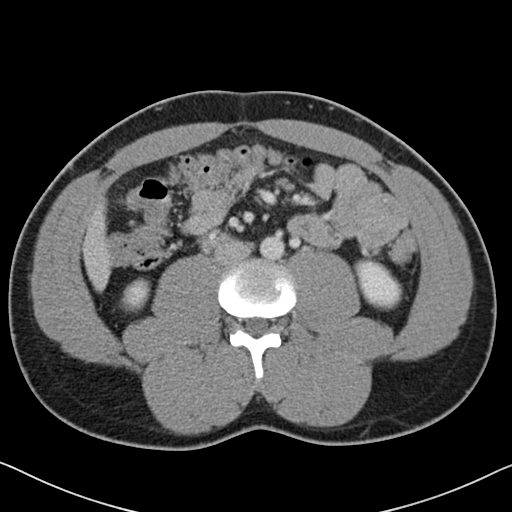
[im 55/87  soft-tissue]
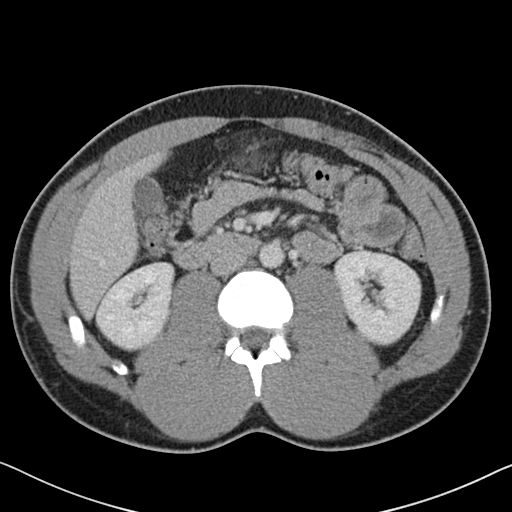
[im 55/87  bone]
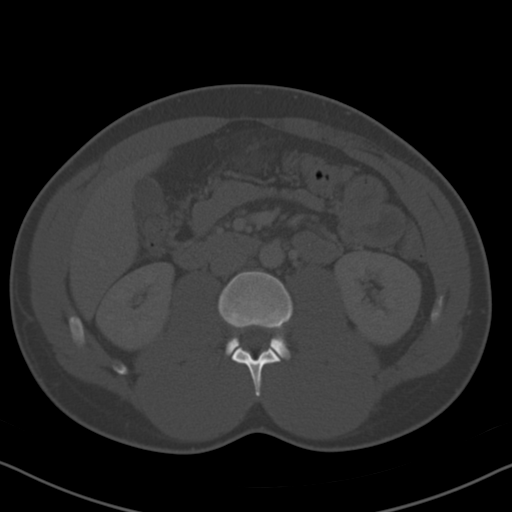
[im 64/87  soft-tissue]
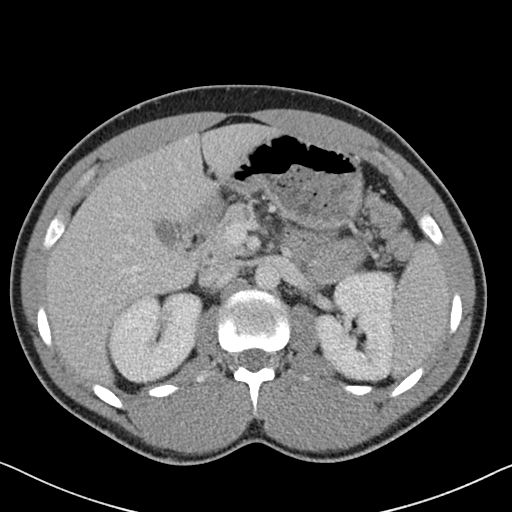
[im 68/87  soft-tissue]
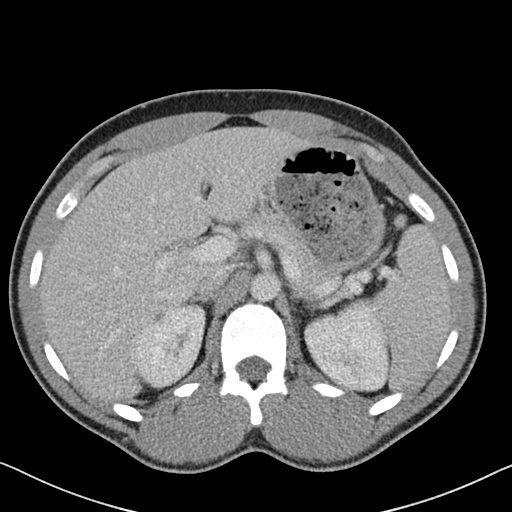
[im 73/87  soft-tissue]
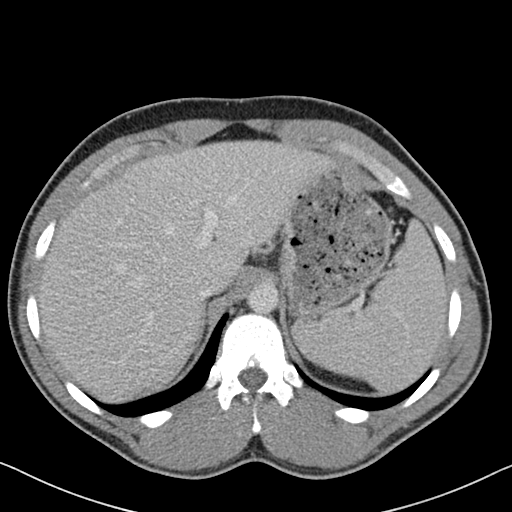
[im 82/87  soft-tissue]
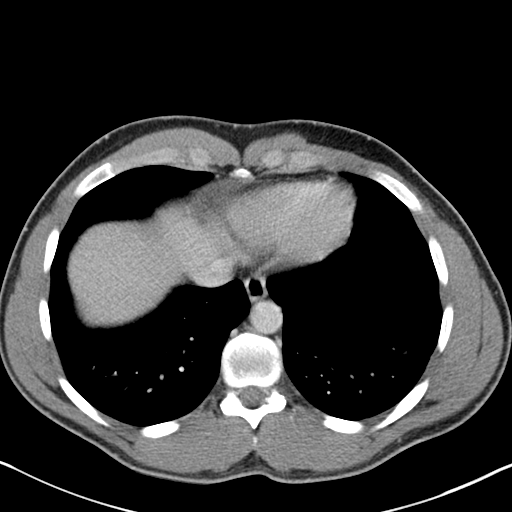

[Series 4: coronal st · coronal · 0.66mm/px · 3 of 79 slices shown]
[im 27/79  soft-tissue]
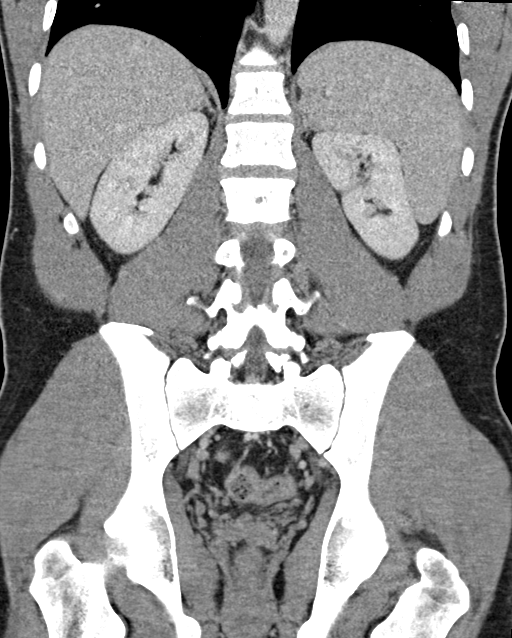
[im 35/79  soft-tissue]
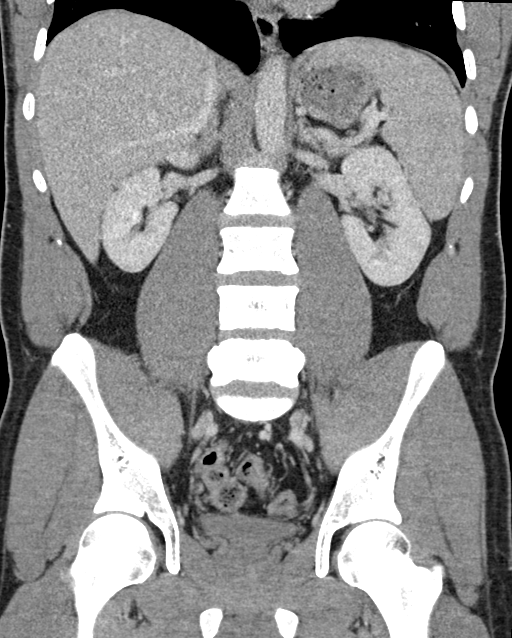
[im 44/79  soft-tissue]
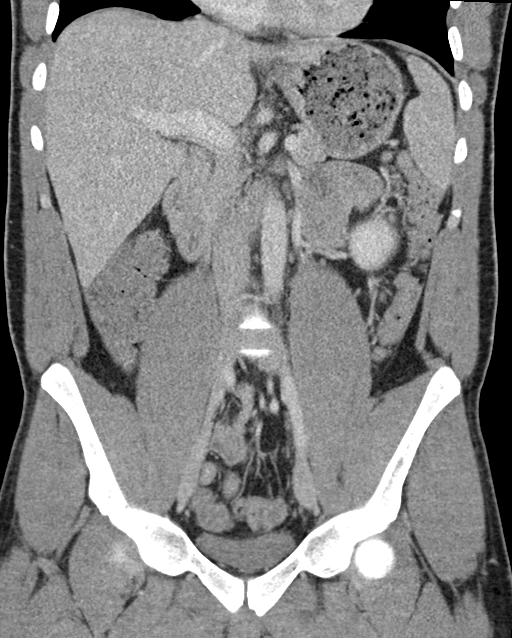

[16 of 46 positions shown; findings below may reference images not displayed]

FINDINGS: Lower chest: No acute abnormality

Hepatobiliary: The liver and gallbladder are unremarkable. No
biliary dilatation.

Pancreas: Unremarkable

Spleen: Unremarkable

Adrenals/Urinary Tract: The kidneys, adrenal glands and bladder are
unremarkable.

Stomach/Bowel: An enlarged appendix is noted compatible with
appendicitis. A 6 mm appendicolith at the appendiceal origin is
noted. No free fluid, abscess, bowel obstruction or
pneumoperitoneum.

No other bowel abnormalities are identified.

Vascular/Lymphatic: No significant vascular findings are present. No
enlarged abdominal or pelvic lymph nodes.

Reproductive: Prostate is unremarkable.

Other: None

Musculoskeletal: No acute or suspicious bony abnormalities.
Bilateral L5 pars defects noted without spondylolisthesis.
IMPRESSION: Appendicitis without complicating features. 6 mm appendicolith at
the appendiceal origin. No free fluid, abscess or pneumoperitoneum.
